# Patient Record
Sex: Female | Born: 1950 | Race: Black or African American | Hispanic: No | Marital: Single | Smoking: Former smoker
Health system: Southern US, Community
[De-identification: ages and names within clinical notes are randomized; demographics above are authoritative.]

## PROBLEM LIST (undated history)

## (undated) DIAGNOSIS — I10 Essential (primary) hypertension: Secondary | ICD-10-CM

## (undated) HISTORY — PX: HAND SURGERY: SHX662

---

## 2009-03-13 ENCOUNTER — Emergency Department (HOSPITAL_COMMUNITY): Admission: EM | Admit: 2009-03-13 | Discharge: 2009-03-13 | Payer: Self-pay | Admitting: Emergency Medicine

## 2010-04-22 ENCOUNTER — Emergency Department (HOSPITAL_COMMUNITY): Admission: EM | Admit: 2010-04-22 | Discharge: 2010-04-23 | Payer: Self-pay | Admitting: Emergency Medicine

## 2010-08-24 ENCOUNTER — Encounter: Payer: Self-pay | Admitting: Internal Medicine

## 2010-08-24 LAB — CONVERTED CEMR LAB
Hemoglobin: 12.8 g/dL
MCV: 93.8 fL
Platelets: 299 10*3/uL
Sed Rate: 17 mm/hr

## 2010-08-25 ENCOUNTER — Encounter (INDEPENDENT_AMBULATORY_CARE_PROVIDER_SITE_OTHER): Payer: Self-pay | Admitting: *Deleted

## 2010-10-11 ENCOUNTER — Ambulatory Visit: Payer: Self-pay | Admitting: Internal Medicine

## 2010-10-11 DIAGNOSIS — K5909 Other constipation: Secondary | ICD-10-CM

## 2011-01-09 NOTE — Assessment & Plan Note (Signed)
Summary: constipation--ch.   History of Present Illness Visit Type: Initial Consult Primary GI MD: Stan Head MD [facility] Primary Jaline Pincock: Natasha Bence Requesting Beth Spackman: Clydie Braun Richter,MD Chief Complaint: constipation, pt was given Miralax at [facility] which helped her now she is out of that prescription History of Present Illness:   60 yo [place] woman with chronic constipation x 20 years. History mostly from interpreter. She has had infrequent defecation problems for years. She was seen at [facility] and was given Miralax three times a day which worked very well. All other GI ROS negative.           Preventive Screening-Counseling & Management  Alcohol-Tobacco     Smoking Status: quit      Drug Use:  no.      Current Medications (verified): 1)  Ultracet 37.5-325 Mg Tabs (Tramadol-Acetaminophen) .... 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  Alcohol Use - yes Daily Caffeine Use Illicit Drug Use - no Smoking Status:  quit Drug Use:  no  Review of Systems       The patient complains of allergy/sinus, arthritis/joint pain, back pain, itching, muscle pains/cramps, and night sweats.         All other ROS negative except as per HPI.   Vital Signs:  Patient profile:   60 year old female Height:      63 inches Weight:      134 pounds BMI:     23.82 BSA:     1.63 Pulse rate:   76 / minute Pulse rhythm:   regular BP sitting:   140 / 90  (left arm)  Vitals Entered By: Merri Ray CMA Duncan Dull) (October 11, 2010 11:12 AM)  Physical Exam  General:  Well developed, well nourished, no acute distress. Eyes:  PERRLA, no icterus. Mouth:  No deformity or lesions, dentition with  caries Neck:  Supple; no masses or thyromegaly. Lungs:  Clear throughout to auscultation. Heart:  Regular rate and rhythm; no murmurs, rubs,  or bruits. Abdomen:  Soft, nontender and nondistended. No masses, hepatosplenomegaly or hernias noted. Normal bowel sounds. Rectal:  female staff present  old external hemorrhoids normal tone, normal voluntary squeeze and valsalva, small amount of bown stool Extremities:  no edema    Impression & Recommendations:  Problem # 1:  CONSTIPATION, CHRONIC (ICD-564.09) Assessment New Clearly a chronic issue so no change in bowels. CBC and ESR ok 08/24/10 She should have a screening colonoscopy at some point and this was recommended. Handouts given also and rationale explained. She is having back and joint pain and wants that evaluated and treated at this time. She will resume MiraLAx and return for screening colonoscopy when ready.  Patient Instructions: 1)  You may purchase Miralax over the counter and use up to 3 times a day. 2)  A screening colonoscopy is recommended.  You can call back to schedule that. 3)  Please return to [facility] to follow up on your back pain and arthritis. 4)  Copy sent to : Nadyne Coombes, MD 5)  The medication list was reviewed and reconciled.  All changed / newly prescribed medications were explained.  A complete medication list was provided to the patient / caregiver.

## 2011-01-09 NOTE — Letter (Signed)
Summary: New Patient letter  [facility]  [address] [place], [place] [address]   Phone: [phone]  Fax: [phone]       08/25/2010 MRN: [id number]  Brandy Bond [address] [place], [place]  [address]  Dear [name],  Welcome to the Gastroenterology Division at [facility].    You are scheduled to see Dr.  Leone Payor on 10-11-10 at 10:30a.m. on the 3rd floor at [facility], [address]  We ask that you try to arrive at our office 15 minutes prior to your appointment time to allow for check-in.  We would like you to complete the enclosed self-administered evaluation form prior to your visit and bring it with you on the day of your appointment.  We will review it with you.  Also, please bring a complete list of all your medications or, if you prefer, bring the medication bottles and we will list them.  Please bring your insurance card so that we may make a copy of it.  If your insurance requires a referral to see a specialist, please bring your referral form from your primary care physician.  Co-payments are due at the time of your visit and may be paid by cash, check or credit card.     Your office visit will consist of a consult with your physician (includes a physical exam), any laboratory testing he/she may order, scheduling of any necessary diagnostic testing (e.g. x-ray, ultrasound, CT-scan), and scheduling of a procedure (e.g. Endoscopy, Colonoscopy) if required.  Please allow enough time on your schedule to allow for any/all of these possibilities.    If you cannot keep your appointment, please call [phone] to cancel or reschedule prior to your appointment date.  This allows [place] the opportunity to schedule an appointment for another patient in need of care.  If you do not cancel or reschedule by 5 p.m. the business day prior to your appointment date, you will be charged a $50.00 late cancellation/no-show fee.    Thank you for choosing  [facility] for your medical needs.  We appreciate the opportunity to care for you.  Please visit [place] at our website  to learn more about our practice.                     Sincerely,                                                             The Gastroenterology Division

## 2011-01-09 NOTE — Letter (Signed)
Summary: [facility]   Imported By: Lester Chenoweth 10/13/2010 09:15:53  _____________________________________________________________________  External Attachment:    Type:   Image     Comment:   External Document

## 2011-02-27 LAB — URINALYSIS, ROUTINE W REFLEX MICROSCOPIC
Glucose, UA: NEGATIVE mg/dL
Hgb urine dipstick: NEGATIVE
pH: 6 (ref 5.0–8.0)

## 2011-02-27 LAB — BASIC METABOLIC PANEL
BUN: 10 mg/dL (ref 6–23)
Calcium: 8.8 mg/dL (ref 8.4–10.5)
Creatinine, Ser: 0.82 mg/dL (ref 0.4–1.2)
GFR calc non Af Amer: >60 mL/min (ref >60)
Sodium: 140 mEq/L (ref 135–145)

## 2011-12-14 ENCOUNTER — Emergency Department (HOSPITAL_COMMUNITY): Payer: No Typology Code available for payment source

## 2011-12-14 ENCOUNTER — Emergency Department (HOSPITAL_COMMUNITY)
Admission: EM | Admit: 2011-12-14 | Discharge: 2011-12-14 | Disposition: A | Discharge status: Home / Self Care | Payer: No Typology Code available for payment source | Attending: Emergency Medicine | Admitting: Emergency Medicine

## 2011-12-14 ENCOUNTER — Encounter: Payer: Self-pay | Admitting: Emergency Medicine

## 2011-12-14 DIAGNOSIS — R05 Cough: Secondary | ICD-10-CM | POA: Insufficient documentation

## 2011-12-14 DIAGNOSIS — M542 Cervicalgia: Secondary | ICD-10-CM | POA: Insufficient documentation

## 2011-12-14 DIAGNOSIS — M545 Low back pain, unspecified: Secondary | ICD-10-CM | POA: Insufficient documentation

## 2011-12-14 DIAGNOSIS — R51 Headache: Secondary | ICD-10-CM | POA: Insufficient documentation

## 2011-12-14 DIAGNOSIS — M25559 Pain in unspecified hip: Secondary | ICD-10-CM | POA: Insufficient documentation

## 2011-12-14 DIAGNOSIS — R079 Chest pain, unspecified: Secondary | ICD-10-CM | POA: Insufficient documentation

## 2011-12-14 DIAGNOSIS — R059 Cough, unspecified: Secondary | ICD-10-CM | POA: Insufficient documentation

## 2011-12-14 DIAGNOSIS — R0602 Shortness of breath: Secondary | ICD-10-CM | POA: Insufficient documentation

## 2011-12-14 DIAGNOSIS — M791 Myalgia, unspecified site: Secondary | ICD-10-CM

## 2011-12-14 DIAGNOSIS — T1490XA Injury, unspecified, initial encounter: Secondary | ICD-10-CM | POA: Insufficient documentation

## 2011-12-14 DIAGNOSIS — IMO0001 Reserved for inherently not codable concepts without codable children: Secondary | ICD-10-CM | POA: Insufficient documentation

## 2011-12-14 HISTORY — DX: Essential (primary) hypertension: I10

## 2011-12-14 MED ORDER — IBUPROFEN 800 MG PO TABS: 800.0000 mg | ORAL_TABLET | Freq: Three times a day (TID) | ORAL | Status: AC

## 2011-12-14 MED ORDER — OXYCODONE-ACETAMINOPHEN 5-325 MG PO TABS: 1.0000 | ORAL_TABLET | ORAL | Status: AC | PRN

## 2011-12-14 MED ORDER — OXYCODONE-ACETAMINOPHEN 5-325 MG PO TABS
1.0000 | ORAL_TABLET | Freq: Once | ORAL | Status: AC
  Administered 2011-12-14: 1 via ORAL
  Filled 2011-12-14: qty 1

## 2011-12-14 MED ORDER — CYCLOBENZAPRINE HCL 10 MG PO TABS: 10.0000 mg | ORAL_TABLET | Freq: Two times a day (BID) | ORAL | Status: AC | PRN
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
## 2011-12-14 NOTE — ED Provider Notes (Signed)
Spoke with patient briefly about the results of her CT's and x-rays. Discussed the fact that she would be sore for the next 2-3 days and that prescriptions have been provided for her pain and discomfort. Encouraged her to take medications as directed and to follow up with her primary care physician if symptoms do not improve. Patient agreeable with plan  Leanne Chang, NP 12/14/11 2200

## 2011-12-14 NOTE — ED Notes (Signed)
Patient transported to X-ray 

## 2011-12-21 NOTE — ED Provider Notes (Signed)
Evaluation and management procedures were performed by the PA/NP under my supervision/collaboration.    Felisa Bonier, MD 12/21/11 [phone]

## 2011-12-21 NOTE — ED Provider Notes (Signed)
Evaluation and management procedures were performed by the PA/NP under my supervision/collaboration.    Lashawnda Hancox D Marvelyn Bouchillon, MD 12/21/11 1629 

## 2014-04-20 ENCOUNTER — Encounter (HOSPITAL_COMMUNITY): Payer: Self-pay | Admitting: Emergency Medicine

## 2014-04-20 ENCOUNTER — Emergency Department (HOSPITAL_COMMUNITY)
Admission: EM | Admit: 2014-04-20 | Discharge: 2014-04-20 | Disposition: A | Discharge status: Home / Self Care | Payer: Self-pay | Attending: Emergency Medicine | Admitting: Emergency Medicine

## 2014-04-20 ENCOUNTER — Emergency Department (HOSPITAL_COMMUNITY): Payer: Self-pay

## 2014-04-20 DIAGNOSIS — Z79899 Other long term (current) drug therapy: Secondary | ICD-10-CM | POA: Insufficient documentation

## 2014-04-20 DIAGNOSIS — I10 Essential (primary) hypertension: Secondary | ICD-10-CM | POA: Insufficient documentation

## 2014-04-20 DIAGNOSIS — M25469 Effusion, unspecified knee: Secondary | ICD-10-CM | POA: Insufficient documentation

## 2014-04-20 DIAGNOSIS — M25462 Effusion, left knee: Secondary | ICD-10-CM

## 2014-04-20 DIAGNOSIS — Z880 Allergy status to penicillin: Secondary | ICD-10-CM | POA: Insufficient documentation

## 2014-04-20 LAB — SYNOVIAL CELL COUNT + DIFF, W/ CRYSTALS
CRYSTALS FLUID: NONE SEEN
EOSINOPHILS-SYNOVIAL: 0 % (ref 0–1)
Lymphocytes-Synovial Fld: 0 % (ref 0–20)
Monocyte-Macrophage-Synovial Fluid: 31 % — ABNORMAL LOW (ref 50–90)
NEUTROPHIL, SYNOVIAL: 69 % — AB (ref 0–25)
WBC, Synovial: 46200 /mm3 — ABNORMAL HIGH (ref 0–200)

## 2014-04-20 MED ORDER — OXYCODONE-ACETAMINOPHEN 5-325 MG PO TABS: 1.0000 | ORAL_TABLET | ORAL | Status: DC | PRN

## 2014-04-20 MED ORDER — MELOXICAM 7.5 MG PO TABS
7.5000 mg | ORAL_TABLET | Freq: Every day | ORAL | Status: DC
Start: 2014-04-20 — End: 2014-06-18

## 2014-04-20 MED ORDER — OXYCODONE-ACETAMINOPHEN 5-325 MG PO TABS: 1.0000 | ORAL_TABLET | Freq: Once | ORAL | Status: DC

## 2014-04-20 MED ORDER — OXYCODONE-ACETAMINOPHEN 5-325 MG PO TABS
1.0000 | ORAL_TABLET | Freq: Once | ORAL | Status: AC
  Administered 2014-04-20: 1 via ORAL
  Filled 2014-04-20: qty 1

## 2014-04-20 NOTE — Progress Notes (Signed)
[facility] did not see patient but will be sending information about [facility], using the address provided, to help patient establish primary care.

## 2014-04-20 NOTE — ED Notes (Signed)
Pt c/om left knee swelling, denies injury. States it hurts when walking on it.

## 2014-04-20 NOTE — Discharge Instructions (Signed)
Knee Effusion  Knee effusion means you have fluid in your knee. The knee may be more difficult to bend and move. HOME CARE  Use crutches or a brace as told by your doctor.  Put ice on the injured area.  Put ice in a plastic bag.  Place a towel between your skin and the bag.  Leave the ice on for 15-20 minutes, 03-04 times a day.  Raise (elevate) your knee as much as possible.  Only take medicine as told by your doctor.  You may need to do strengthening exercises. Ask your doctor.  Continue with your normal diet and activities as told by your doctor. GET HELP RIGHT AWAY IF:  You have more puffiness (swelling) in your knee.  You see redness, puffiness, or have more pain in your knee.  You have a temperature by mouth above 102 F (38.9 C).  You get a rash.  You have trouble breathing.  You have a reaction to any medicine you are taking.  You have a lot of pain when you move your knee. MAKE SURE YOU:  Understand these instructions.  Will watch your condition.  Will get help right away if you are not doing well or get worse. Document Released: 12/29/2010 Document Revised: 02/18/2012 Document Reviewed: 12/29/2010 [facility] Patient Information 2014 [place], [place]. Cryotherapy Cryotherapy means treatment with cold. Ice or gel packs can be used to reduce both pain and swelling. Ice is the most helpful within the first 24 to 48 hours after an injury or flareup from overusing a muscle or joint. Sprains, strains, spasms, burning pain, shooting pain, and aches can all be eased with ice. Ice can also be used when recovering from surgery. Ice is effective, has very few side effects, and is safe for most people to use. PRECAUTIONS  Ice is not a safe treatment option for people with:  Raynaud's phenomenon. This is a condition affecting small blood vessels in the extremities. Exposure to cold may cause your problems to return.  Cold hypersensitivity. There are many forms of cold  hypersensitivity, including:  Cold urticaria. Red, itchy hives appear on the skin when the tissues begin to warm after being iced.  Cold erythema. This is a red, itchy rash caused by exposure to cold.  Cold hemoglobinuria. Red blood cells break down when the tissues begin to warm after being iced. The hemoglobin that carry oxygen are passed into the urine because they cannot combine with blood proteins fast enough.  Numbness or altered sensitivity in the area being iced. If you have any of the following conditions, do not use ice until you have discussed cryotherapy with your caregiver:  Heart conditions, such as arrhythmia, angina, or chronic heart disease.  High blood pressure.  Healing wounds or open skin in the area being iced.  Current infections.  Rheumatoid arthritis.  Poor circulation.  Diabetes. Ice slows the blood flow in the region it is applied. This is beneficial when trying to stop inflamed tissues from spreading irritating chemicals to surrounding tissues. However, if you expose your skin to cold temperatures for too long or without the proper protection, you can damage your skin or nerves. Watch for signs of skin damage due to cold. HOME CARE INSTRUCTIONS Follow these tips to use ice and cold packs safely.  Place a dry or damp towel between the ice and skin. A damp towel will cool the skin more quickly, so you may need to shorten the time that the ice is used.  For  a more rapid response, add gentle compression to the ice.  Ice for no more than 10 to 20 minutes at a time. The bonier the area you are icing, the less time it will take to get the benefits of ice.  Check your skin after 5 minutes to make sure there are no signs of a poor response to cold or skin damage.  Rest 20 minutes or more in between uses.  Once your skin is numb, you can end your treatment. You can test numbness by very lightly touching your skin. The touch should be so light that you do not see  the skin dimple from the pressure of your fingertip. When using ice, most people will feel these normal sensations in this order: cold, burning, aching, and numbness.  Do not use ice on someone who cannot communicate their responses to pain, such as small children or people with dementia. HOW TO MAKE AN ICE PACK Ice packs are the most common way to use ice therapy. Other methods include ice massage, ice baths, and cryo-sprays. Muscle creams that cause a cold, tingly feeling do not offer the same benefits that ice offers and should not be used as a substitute unless recommended by your caregiver. To make an ice pack, do one of the following:  Place crushed ice or a bag of frozen vegetables in a sealable plastic bag. Squeeze out the excess air. Place this bag inside another plastic bag. Slide the bag into a pillowcase or place a damp towel between your skin and the bag.  Mix 3 parts water with 1 part rubbing alcohol. Freeze the mixture in a sealable plastic bag. When you remove the mixture from the freezer, it will be slushy. Squeeze out the excess air. Place this bag inside another plastic bag. Slide the bag into a pillowcase or place a damp towel between your skin and the bag. SEEK MEDICAL CARE IF:  You develop white spots on your skin. This may give the skin a blotchy (mottled) appearance.  Your skin turns blue or pale.  Your skin becomes waxy or hard.  Your swelling gets worse. MAKE SURE YOU:   Understand these instructions.  Will watch your condition.  Will get help right away if you are not doing well or get worse. Document Released: 07/23/2011 Document Revised: 02/18/2012 Document Reviewed: 07/23/2011 [facility] Patient Information 2014 [place].

## 2014-04-20 NOTE — ED Provider Notes (Signed)
CSN: [id number]     Arrival date & time 04/20/14  0931 History   First MD Initiated Contact with Patient 04/20/14 1013     Chief Complaint  Patient presents with  . Joint Swelling    left     (Consider location/radiation/quality/duration/timing/severity/associated sxs/prior Treatment) HPI Comments:  Brandy Bond is a 63 yo female presenting with a chief complaint of left knee swelling since last night. Pt stated she has daily knee pain as she works as a Social research officer, government, but yesterday morning the pain was worse than usual. Pt noticed the knee became swollen last night and increasingly painful behind the knee, making it difficult to bear weight. Pt denies any previous episodes of knee or joint swelling. Pt takes naproxen for her daily knee pain, but it did not improve her pain today. Pt rates pain 10/10. Pt denies fever, chest pain, shortness of breath, calf pain, and ankle edema.  The history is provided by the patient.    Past Medical History  Diagnosis Date  . Hypertension    History reviewed. No pertinent past surgical history. No family history on file. History  Substance Use Topics  . Smoking status: Never Smoker   . Smokeless tobacco: Not on file  . Alcohol Use: No   OB History   Grav Para Term Preterm Abortions TAB SAB Ect Mult Living                 Review of Systems  Constitutional:       Denies fever and chills  Cardiovascular:       Denies chest pain, SOB, and cough  Gastrointestinal:       Denies abdominal pain, nausea, and vomiting  Genitourinary:       Denies dysuria and hematuria  Musculoskeletal:       Endorses left knee pain and swelling. Denies calf pain or thigh pain  Skin:       Denies rash      Allergies  Penicillins  Home Medications   Prior to Admission medications   Medication Sig Start Date End Date Taking? Authorizing Provider  Iron-Vitamins (GERITOL COMPLETE PO) Take 1 tablet by mouth daily.     Yes Historical Provider, MD  naproxen sodium  (ANAPROX) 220 MG tablet Take 220 mg by mouth daily as needed. pain    Yes Historical Provider, MD   BP 153/88  Pulse 103  Temp(Src) 98.5 F (36.9 C) (Oral)  Resp 16  SpO2 99% Physical Exam  Constitutional: She is oriented to person, place, and time. She appears well-developed and well-nourished. No distress.  Cardiovascular: Normal rate.   No murmur heard. Pulmonary/Chest: Effort normal. She has no wheezes. She has no rales.  Abdominal: Soft.  Musculoskeletal:  Left knee with anterior and posterior swelling, tender to moderate palpation posteriorly. Decreased ROM secondary to pain. 2+ popliteal pulse, 2+ dorsal pedal pulse. Without crepitus, bony deformity, or evidence of trauma. No calf or thigh tenderness or swelling. No redness or warmth to palpation.  Neurological: She is alert and oriented to person, place, and time.  Skin: Skin is warm and dry. No erythema.    ED Course  ARTHOCENTESIS Date/Time: 04/20/2014 12:40 PM Performed by: Elpidio Anis A Authorized by: Elpidio Anis A Consent: Verbal consent obtained. written consent not obtained. Risks and benefits: risks, benefits and alternatives were discussed Consent given by: patient Patient understanding: patient states understanding of the procedure being performed Patient identity confirmed: verbally with patient Indications: joint swelling,  pain and diagnostic  evaluation  Body area: knee Joint: left knee Local anesthesia used: yes Anesthesia: local infiltration Local anesthetic: lidocaine 2% with epinephrine Anesthetic total: 2 ml Patient sedated: no Preparation: Patient was prepped and draped in the usual sterile fashion. Needle gauge: 18 G Ultrasound guidance: no Approach: medial Aspirate: yellow and clear Aspirate amount: 20 ml Patient tolerance: Patient tolerated the procedure well with no immediate complications.   (including critical care time) Labs Review Labs Reviewed - No data to display  Imaging  Review Dg Knee Complete 4 Views Left  04/20/2014   CLINICAL DATA:  LEFT knee pain and swelling for 2 days, no known injury  EXAM: LEFT KNEE - COMPLETE 4+ VIEW  COMPARISON:  None  FINDINGS: Diffuse osseous demineralization.  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  No acute fracture, dislocation or bone destruction.  Large knee joint effusion.  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  Osseous demineralization.   Electronically Signed   By: Ulyses SouthwardMark  Boles M.D.   On: 04/20/2014 10:09     EKG Interpretation None      MDM   Final diagnoses:  None    1. Left knee effusion  Referred to orthopedics for further management. Knee immobilizer and crutches for support. Pain management provided.   Arnoldo HookerShari A Travez Stancil, PA-C 04/20/14 1241

## 2014-04-23 LAB — BODY FLUID CULTURE
Culture: NO GROWTH
Special Requests: NORMAL

## 2014-04-23 NOTE — ED Provider Notes (Signed)
Medical screening examination/treatment/procedure(s) were performed by non-physician practitioner and as supervising physician I was immediately available for consultation/collaboration.   EKG Interpretation None        Gwyneth Sprout, MD 04/23/14 1556

## 2014-06-18 ENCOUNTER — Encounter (HOSPITAL_COMMUNITY): Payer: Self-pay | Admitting: Emergency Medicine

## 2014-06-18 ENCOUNTER — Emergency Department (HOSPITAL_COMMUNITY)
Admission: EM | Admit: 2014-06-18 | Discharge: 2014-06-18 | Disposition: A | Discharge status: Home / Self Care | Payer: Worker's Compensation | Attending: Emergency Medicine | Admitting: Emergency Medicine

## 2014-06-18 ENCOUNTER — Emergency Department (HOSPITAL_COMMUNITY): Payer: Worker's Compensation

## 2014-06-18 DIAGNOSIS — S52509A Unspecified fracture of the lower end of unspecified radius, initial encounter for closed fracture: Secondary | ICD-10-CM | POA: Diagnosis not present

## 2014-06-18 DIAGNOSIS — S52501A Unspecified fracture of the lower end of right radius, initial encounter for closed fracture: Secondary | ICD-10-CM

## 2014-06-18 DIAGNOSIS — W108XXA Fall (on) (from) other stairs and steps, initial encounter: Secondary | ICD-10-CM | POA: Insufficient documentation

## 2014-06-18 DIAGNOSIS — S52609A Unspecified fracture of lower end of unspecified ulna, initial encounter for closed fracture: Secondary | ICD-10-CM | POA: Diagnosis not present

## 2014-06-18 DIAGNOSIS — Z791 Long term (current) use of non-steroidal anti-inflammatories (NSAID): Secondary | ICD-10-CM | POA: Diagnosis not present

## 2014-06-18 DIAGNOSIS — Y9289 Other specified places as the place of occurrence of the external cause: Secondary | ICD-10-CM | POA: Insufficient documentation

## 2014-06-18 DIAGNOSIS — Y9389 Activity, other specified: Secondary | ICD-10-CM | POA: Insufficient documentation

## 2014-06-18 DIAGNOSIS — S59909A Unspecified injury of unspecified elbow, initial encounter: Secondary | ICD-10-CM | POA: Diagnosis present

## 2014-06-18 DIAGNOSIS — S52601A Unspecified fracture of lower end of right ulna, initial encounter for closed fracture: Secondary | ICD-10-CM

## 2014-06-18 DIAGNOSIS — Z79899 Other long term (current) drug therapy: Secondary | ICD-10-CM | POA: Insufficient documentation

## 2014-06-18 DIAGNOSIS — Z880 Allergy status to penicillin: Secondary | ICD-10-CM | POA: Insufficient documentation

## 2014-06-18 DIAGNOSIS — I10 Essential (primary) hypertension: Secondary | ICD-10-CM | POA: Insufficient documentation

## 2014-06-18 MED ORDER — MORPHINE SULFATE 4 MG/ML IJ SOLN
4.0000 mg | Freq: Once | INTRAMUSCULAR | Status: AC
  Administered 2014-06-18: 4 mg via INTRAVENOUS
  Filled 2014-06-18: qty 1

## 2014-06-18 MED ORDER — ONDANSETRON HCL 4 MG/2ML IJ SOLN
4.0000 mg | Freq: Once | INTRAMUSCULAR | Status: AC
  Administered 2014-06-18: 4 mg via INTRAVENOUS
  Filled 2014-06-18: qty 2

## 2014-06-18 MED ORDER — HYDROMORPHONE HCL PF 1 MG/ML IJ SOLN
1.0000 mg | Freq: Once | INTRAMUSCULAR | Status: AC
  Administered 2014-06-18: 1 mg via INTRAVENOUS
  Filled 2014-06-18: qty 1

## 2014-06-18 MED ORDER — OXYCODONE-ACETAMINOPHEN 5-325 MG PO TABS: 1.0000 | ORAL_TABLET | ORAL | Status: DC | PRN
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
## 2014-06-18 NOTE — Discharge Instructions (Signed)
Please call Dr. Ronie SpiesWeingold's office first thing Monday morning.  Read the information mode carefully. Please ice and elevate your wrist. Do not use or hand for daily activities. Discharge ED with a strong pain medication. Do not drive, operate heavy machinery, drink alcohol, or take other tylenol containing products with this medicine.  SEEK IMMEDIATE MEDICAL CARE IF:  Your cast or splint gets damaged or breaks. You have continued severe pain or more swelling than you did before the cast was put on. Your skin or fingernails below the injury turn blue or gray or feel cold or numb. You develop decreased feeling in your fingers.     Wrist Fracture A wrist fracture is a break in one of the bones of the wrist. Your wrist is made up of several small bones at the palm of your hand (carpal bones) and the two bones that make up your forearm (radius and ulna). The bones come together to form multiple large and small joints. The shape and design of these joints allow your wrist to bend and straighten, move side-to-side, and rotate, as in twisting your palm up or down. CAUSES  A fracture may occur in any of the bones in your wrist when enough force is applied to the wrist, such as when falling down onto an outstretched hand. Severe injuries may occur from a more forceful injury. SYMPTOMS Symptoms of wrist fractures include tenderness, bruising, and swelling. Additionally, the wrist may hang in an odd position or may be misshaped. DIAGNOSIS To diagnose a wrist fracture, your caregiver will physically examine your wrist. Your caregiver may also request an X-ray exam of your wrist. TREATMENT Treatment depends on many factors, including the nature and location of the fracture, your age, and your activity level. Treatment for wrist fracture can be nonsurgical or surgical. For nonsurgical treatment, a plaster cast or splint may be applied to your wrist if the bone is in a good position (aligned). The cast will  stay on for about 6 weeks. If the alignment of your bone is not good, it may be necessary to realign (reduce) it. After the bone is reduced, a splint usually is placed on your wrist to allow for a small amount of normal swelling. After about 1 week, the splint is removed and a cast is added. The cast is removed 2 or 3 weeks later, after the swelling goes down, causing the cast to loosen. Another cast is applied. This cast is removed after about another 2 or 3 weeks, for a total of 4 to 6 weeks of immobilization. Sometimes the position of the bone is so far out of place that surgery is required to apply a device to hold it together as it heals. If the bone cannot be reduced without cutting the skin around the bone (closed reduction), a cut (incision) is made to allow direct access to the bone to reduce it (open reduction). Depending on the fracture, there are a number of options for holding the bone in place while it heals, including a cast, metal pins, a plate and screws, and a device called an external fixator. With an external fixator, most of the hardware remains outside of the body. HOME CARE INSTRUCTIONS  To lessen swelling, keep your injured wrist elevated and move your fingers as much as possible.  Apply ice to your wrist for the first 1 to 2 days after you have been treated or as directed by your caregiver. Applying ice helps to reduce inflammation and pain.  Put  ice in a plastic bag.  Place a towel between your skin and the bag.  Leave the ice on for 15 to 20 minutes at a time, every 2 hours while you are awake.  Do not put pressure on any part of your cast or splint. It may break.  Use a plastic bag to protect your cast or splint from water while bathing or showering. Do not lower your cast or splint into water.  Only take over-the-counter or prescription medicines for pain as directed by your caregiver.  MAKE SURE YOU:  Understand these instructions.  Will watch your  condition.  Will get help right away if you are not doing well or get worse. Document Released: 09/05/2005 Document Revised: 02/18/2012 Document Reviewed: 12/14/2011 [facility] Patient Information 2015 [place], [place]. This information is not intended to replace advice given to you by your health care provider. Make sure you discuss any questions you have with your health care provider.

## 2014-06-18 NOTE — ED Notes (Addendum)
Per EMS: Pt reports sweeping steps at work and falling down three steps. Pt reports extending her arm and now reports wrist pain. Pt denies neck, back, or hip pain. Pt was given 150 mcgs of fentanyl in route to facility. Capillary refill is less than two seconds.

## 2014-06-18 NOTE — ED Provider Notes (Signed)
CSN: [id number]     Arrival date & time 06/18/14  1625 History   First MD Initiated Contact with Patient 06/18/14 1629     Chief Complaint  Patient presents with  . Wrist Injury     (Consider location/radiation/quality/duration/timing/severity/associated sxs/prior Treatment) HPI Comments:  Brandy Bond is a 63 y.o. female who sustained a right wrist injury just prior to arrival. Mechanism of injury: Patient fell backward on the stairs onto her R hand. Immediate symptoms: immediate pain, immediate swelling, inability to use arm directly after injury, deformity was immediately noted. Symptoms have been acute and constant since that time. Prior history of related problems: no prior problems with this area in the past. Denies numbness or tingling     Patient is a 63 y.o. female presenting with wrist pain. The history is provided by the patient. No language interpreter was used.  Wrist Pain This is a new problem. The current episode started today. The problem occurs constantly. The problem has been unchanged. Associated symptoms include arthralgias and joint swelling. Pertinent negatives include no abdominal pain, anorexia, change in bowel habit, chest pain, chills, congestion, coughing, diaphoresis, fatigue, fever, headaches, myalgias, nausea, neck pain, numbness, rash, sore throat, swollen glands, urinary symptoms, vertigo, visual change, vomiting or weakness. Exacerbated by: any movement. She has tried nothing for the symptoms.    Past Medical History  Diagnosis Date  . Hypertension    History reviewed. No pertinent past surgical history. No family history on file. History  Substance Use Topics  . Smoking status: Never Smoker   . Smokeless tobacco: Not on file  . Alcohol Use: No   OB History   Grav Para Term Preterm Abortions TAB SAB Ect Mult Living                 Review of Systems  Constitutional: Negative for fever, chills, diaphoresis and fatigue.  HENT: Negative for  congestion and sore throat.   Respiratory: Negative for cough.   Cardiovascular: Negative for chest pain.  Gastrointestinal: Negative for nausea, vomiting, abdominal pain, anorexia and change in bowel habit.  Musculoskeletal: Positive for arthralgias and joint swelling. Negative for myalgias and neck pain.  Skin: Negative for rash.  Neurological: Negative for vertigo, weakness, numbness and headaches.  All other systems reviewed and are negative.     Allergies  Penicillins  Home Medications   Prior to Admission medications   Medication Sig Start Date End Date Taking? Authorizing Provider  Iron-Vitamins (GERITOL COMPLETE PO) Take 1 tablet by mouth daily.      Historical Provider, MD  meloxicam (MOBIC) 7.5 MG tablet Take 1 tablet (7.5 mg total) by mouth daily. 04/20/14   Shari A Upstill, PA-C  naproxen sodium (ANAPROX) 220 MG tablet Take 220 mg by mouth daily as needed. pain     Historical Provider, MD  oxyCODONE-acetaminophen (PERCOCET/ROXICET) 5-325 MG per tablet Take 1-2 tablets by mouth every 4 (four) hours as needed for severe pain. 04/20/14   Shari A Upstill, PA-C   BP 185/113  Pulse 86  Temp(Src) 98.5 F (36.9 C) (Oral)  Resp 20  SpO2 95% Physical Exam  Nursing note and vitals reviewed. Constitutional: She is oriented to person, place, and time. She appears well-developed and well-nourished. No distress.  Appears extremely uncomfortable.  HENT:  Head: Normocephalic and atraumatic.  Eyes: Conjunctivae are normal. No scleral icterus.  Neck: Normal range of motion.  Cardiovascular: Normal rate, regular rhythm and normal heart sounds.  Exam reveals no gallop and no friction  rub.   No murmur heard. Pulmonary/Chest: Effort normal and breath sounds normal. No respiratory distress.  Abdominal: Soft. Bowel sounds are normal. She exhibits no distension and no mass. There is no tenderness. There is no guarding.  Musculoskeletal:  A right wrist exam was performed. SKIN:  intact SWELLING: moderate ROM: limited by pain TENDERNESS: severe and at fracture site DEFORMITY: dinner fork deformity NEUROVASCULAR EXAM normal   Neurological: She is alert and oriented to person, place, and time.  Skin: Skin is warm and dry. She is not diaphoretic.    ED Course  Procedures (including critical care time) Labs Review Labs Reviewed - No data to display  Imaging Review No results found.   EKG Interpretation None      MDM   Final diagnoses:  Fracture of radius and ulna, distal, right, closed, initial encounter    Patient with FOOSH injury to the right wrist. + dinner fork deformity.  NV intact. Right hand dominant. Awaiting imaging results. 6:53 PM Patient with comminuted and displaced fractures of the distal radius and ulna of the right wrist. I've spoken with Dr. Mina Marble who has reviewed the radiographs. Patient will be placed in sugartong Splint. Pain medications. She is to follow up with Dr. Mina Marble first thing Monday morning. I explained to the patient that this will require surgical repair. Patient understands and expresses agreement with plan of care. She should ice and elevate the arm above her heart. Discussed return precautions with the patient. I personally reviewed the imaging tests through PACS system. I have reviewed and interpreted Lab values. I reviewed available ER/hospitalization records through the EMR    Arthor Captain, [place] 06/18/14 1859

## 2014-06-18 NOTE — Progress Notes (Signed)
  CARE MANAGEMENT ED NOTE 06/18/2014  Patient:  Brandy Bond, Brandy Bond   Account Number:  [id number]  Date Initiated:  06/18/2014  Documentation initiated by:  Radford Pax  Subjective/Objective Assessment:   Patient presents to Ed post fall sustaining comminuted and displaced fractures of the distal radius and ulna of the right wrist     Subjective/Objective Assessment Detail:     Action/Plan:   Action/Plan Detail:   Anticipated DC Date:       Status Recommendation to Physician:   Result of Recommendation:    Other ED Services  Consult Working Plan    DC Planning Services  Other  PCP issues    Choice offered to / List presented to:            Status of service:  Completed, signed off  ED Comments:   ED Comments Detail:  EDCM spoke to patient and her sister at bedside.  Patient's sister help translate to patient.  Patient's sister reports EDP has given her the name of the orthopedic doctor to follow up with but patient does not have a pcp.  [facility] provided patient with pamphlet to [facility]  Informed patient that walk ins are welcome from Mon-Thurs from 9am -1030am. Also infomed patient that she may speak to a Artist, Child psychotherapist and receive assistance with her medications at [facility].  EDCM also provided patient with list of pcps who accept self pay patients, list of discounted pharmacies and websites needymeds.org and GoodRX.com for medication assistance, financial resources in the community such as local churches and [employer], urban ministries, phone number to inquire about [employer], and DSS for [employer].  Patient thankful for resources. No further EDCM needs at this time.

## 2014-08-11 ENCOUNTER — Emergency Department (HOSPITAL_COMMUNITY)
Admission: EM | Admit: 2014-08-11 | Discharge: 2014-08-11 | Disposition: A | Discharge status: Home / Self Care | Payer: No Typology Code available for payment source | Attending: Emergency Medicine | Admitting: Emergency Medicine

## 2014-08-11 ENCOUNTER — Encounter (HOSPITAL_COMMUNITY): Payer: Self-pay | Admitting: Emergency Medicine

## 2014-08-11 DIAGNOSIS — M1710 Unilateral primary osteoarthritis, unspecified knee: Secondary | ICD-10-CM | POA: Insufficient documentation

## 2014-08-11 DIAGNOSIS — M1712 Unilateral primary osteoarthritis, left knee: Secondary | ICD-10-CM

## 2014-08-11 DIAGNOSIS — Z79899 Other long term (current) drug therapy: Secondary | ICD-10-CM | POA: Insufficient documentation

## 2014-08-11 DIAGNOSIS — M25462 Effusion, left knee: Secondary | ICD-10-CM

## 2014-08-11 DIAGNOSIS — M25469 Effusion, unspecified knee: Secondary | ICD-10-CM | POA: Insufficient documentation

## 2014-08-11 DIAGNOSIS — M25562 Pain in left knee: Secondary | ICD-10-CM

## 2014-08-11 DIAGNOSIS — M25569 Pain in unspecified knee: Secondary | ICD-10-CM | POA: Insufficient documentation

## 2014-08-11 DIAGNOSIS — I10 Essential (primary) hypertension: Secondary | ICD-10-CM | POA: Insufficient documentation

## 2014-08-11 DIAGNOSIS — G8929 Other chronic pain: Secondary | ICD-10-CM | POA: Insufficient documentation

## 2014-08-11 DIAGNOSIS — IMO0002 Reserved for concepts with insufficient information to code with codable children: Secondary | ICD-10-CM | POA: Insufficient documentation

## 2014-08-11 DIAGNOSIS — Z880 Allergy status to penicillin: Secondary | ICD-10-CM | POA: Insufficient documentation

## 2014-08-11 MED ORDER — OXYCODONE-ACETAMINOPHEN 5-325 MG PO TABS
1.0000 | ORAL_TABLET | Freq: Once | ORAL | Status: AC
  Administered 2014-08-11: 1 via ORAL
  Filled 2014-08-11: qty 1

## 2014-08-11 MED ORDER — OXYCODONE-ACETAMINOPHEN 5-325 MG PO TABS: 1.0000 | ORAL_TABLET | ORAL | Status: DC | PRN

## 2014-08-11 MED ORDER — LIDOCAINE HCL 2 % IJ SOLN
INTRAMUSCULAR | Status: AC
  Administered 2014-08-11: 400 mg
  Filled 2014-08-11: qty 20

## 2014-08-11 MED ORDER — TRIAMCINOLONE ACETONIDE 10 MG/ML IJ SUSP
10.0000 mg | Freq: Once | INTRAMUSCULAR | Status: DC
  Filled 2014-08-11: qty 1

## 2014-08-11 MED ORDER — TRIAMCINOLONE ACETONIDE 40 MG/ML IJ SUSP
10.0000 mg | Freq: Once | INTRAMUSCULAR | Status: AC
  Administered 2014-08-11: 10 mg via INTRA_ARTICULAR
  Filled 2014-08-11: qty 0.25

## 2014-08-11 NOTE — ED Provider Notes (Signed)
CSN: [id number]     Arrival date & time 08/11/14  1255 History   First MD Initiated Contact with Patient 08/11/14 1314     Chief Complaint  Patient presents with  . Knee Pain  . Joint Swelling      HPI Patient presents to the emergency department with left knee pain.  She's had chronic left knee pain for some time he reports worsening pain over the past several days.  No fevers or chills.  No redness.  She reports some pain in the posterior aspect of her left knee.  Patient is a moderate left knee joint effusion on examination.  Reports some pain with range of motion but her range of motion is not limited.  She has a history of osteoarthritis in his left knee and chronic left knee pain.  She's never seen an orthopedic surgeon for followup.  She does not have her primary care physician   Past Medical History  Diagnosis Date  . Hypertension    History reviewed. No pertinent past surgical history. History reviewed. No pertinent family history. History  Substance Use Topics  . Smoking status: Never Smoker   . Smokeless tobacco: Not on file  . Alcohol Use: No   OB History   Grav Para Term Preterm Abortions TAB SAB Ect Mult Living                 Review of Systems    Allergies  Penicillins  Home Medications   Prior to Admission medications   Medication Sig Start Date End Date Taking? Authorizing Provider  amLODipine (NORVASC) 10 MG tablet Take 10 mg by mouth daily.   Yes Historical Provider, MD  Aspirin-Caffeine (BAYER BACK & BODY PAIN EX ST) 500-32.5 MG TABS Take 1 tablet by mouth as needed (for pain).   Yes Historical Provider, MD  Iron-Vitamins (GERITOL COMPLETE PO) Take 1 tablet by mouth daily.     Yes Historical Provider, MD  naproxen sodium (ANAPROX) 220 MG tablet Take 220 mg by mouth daily as needed. pain    Yes Historical Provider, MD  oxyCODONE-acetaminophen (PERCOCET/ROXICET) 5-325 MG per tablet Take 1 tablet by mouth every 4 (four) hours as needed for severe pain.  08/11/14   Lyanne Co, MD   BP 172/93  Pulse 75  Temp(Src) 98.3 F (36.8 C) (Oral)  Resp 16  SpO2 97% Physical Exam  ED Course  ARTHOCENTESIS Date/Time: 08/11/2014 3:45 PM Performed by: Lyanne Co Authorized by: Lyanne Co Consent: Verbal consent obtained. Risks and benefits: risks, benefits and alternatives were discussed Consent given by: patient Required items: required blood products, implants, devices, and special equipment available Patient identity confirmed: verbally with patient Time out: Immediately prior to procedure a "time out" was called to verify the correct patient, procedure, equipment, support staff and site/side marked as required. Indications: joint swelling and pain  Body area: knee Joint: left knee Local anesthesia used: yes Anesthesia: local infiltration Local anesthetic: bupivacaine 0.25% with epinephrine Anesthetic total: 4 ml Patient sedated: no Preparation: Patient was prepped and draped in the usual sterile fashion. Needle gauge: 20 G Ultrasound guidance: no Approach: lateral Aspirate: serous Aspirate amount: 40 ml Triamcinolone amount: 10 mg Lidocaine 1% amount: 4 ml Patient tolerance: Patient tolerated the procedure well with no immediate complications. Comments: Synovial fluid sent for culture    Labs Review Labs Reviewed  BODY FLUID CULTURE    Imaging Review No results found.   EKG Interpretation None  MDM   Final diagnoses:  Joint effusion, knee, left  Left knee pain  Osteoarthritis of left knee, unspecified osteoarthritis type    Patient with chronic left knee pain likely secondary to severe degenerative osteoarthritis.  She will need orthopedic followup.  She does not have a primary care physician.  She'll be prescribed a short course of pain medicine.  I aspirated the synovial fluid for pain relief.  She had 4 mL of lidocaine instilled into her left knee as well as 10 mg of Kenalog for pain control.  Her  synovial fluid did not appear infected.  This was sent for culture.  Case management came and evaluated the patient is given her resources regarding primary care physicians in the community.  She understands the importance of orthopedic followup.  She consented arthrocentesis and understands the risk for infection    Lyanne Co, MD 08/11/14 1555

## 2014-08-11 NOTE — Progress Notes (Signed)
[facility] Community Liaison Stacy,  ° °Provided pt with a list of primary care resources and a [facility] [employer] Card application to help patient establish primary care.  °

## 2014-08-11 NOTE — Progress Notes (Signed)
  CARE MANAGEMENT ED NOTE 08/11/2014  Patient:  Brandy Bond, Brandy Bond   Account Number:  [id number]  Date Initiated:  08/11/2014  Documentation initiated by:  Edd Arbour  Subjective/Objective Assessment:   63 yr old self pay [place] c/o increasing L knee swelling and pain x 2 weeks.  Pain score 10/10.  Pt reports she has been seen at [facility] previously for same and "fluid was drawn off."     Subjective/Objective Assessment Detail:   no pcp as confirmed by pt and family at bedside  [facility] staff spoke with pt see [facility] note  Dr Patria Mane present removing fluid from pt knee during CM interaction with pt and family members  CM consult for Needs primary care MD and likely referral to orthopedics     Action/Plan:   ED CM spoke with to review and answer further questions from family about appointments Discussed specialists connected with [facility]   Action/Plan Detail:   Anticipated DC Date:  08/11/2014     Status Recommendation to Physician:   Result of Recommendation:    Other ED Services  Consult Working Plan    DC Planning Services  Other  PCP issues  Outpatient Services - Pt will follow up    Choice offered to / List presented to:            Status of service:  Completed, signed off  ED Comments:   ED Comments Detail:

## 2014-08-11 NOTE — Discharge Instructions (Signed)
Joint Injection  Care After  Refer to this sheet in the next few days. These instructions provide you with information on caring for yourself after you have had a joint injection. Your caregiver also may give you more specific instructions. Your treatment has been planned according to current medical practices, but problems sometimes occur. Call your caregiver if you have any problems or questions after your procedure.  After any type of joint injection, it is not uncommon to experience:  · Soreness, swelling, or bruising around the injection site.  · Mild numbness, tingling, or weakness around the injection site caused by the numbing medicine used before or with the injection.  It also is possible to experience the following effects associated with the specific agent after injection:  · Iodine-based contrast agents:  ¨ Allergic reaction (itching, hives, widespread redness, and swelling beyond the injection site).  · Corticosteroids (These effects are rare.):  ¨ Allergic reaction.  ¨ Increased blood sugar levels (If you have diabetes and you notice that your blood sugar levels have increased, notify your caregiver).  ¨ Increased blood pressure levels.  ¨ Mood swings.  · Hyaluronic acid in the use of viscosupplementation.  ¨ Temporary heat or redness.  ¨ Temporary rash and itching.  ¨ Increased fluid accumulation in the injected joint.  These effects all should resolve within a day after your procedure.   HOME CARE INSTRUCTIONS  · Limit yourself to light activity the day of your procedure. Avoid lifting heavy objects, bending, stooping, or twisting.  · Take prescription or over-the-counter pain medication as directed by your caregiver.  · You may apply ice to your injection site to reduce pain and swelling the day of your procedure. Ice may be applied 03-04 times:  ¨ Put ice in a plastic bag.  ¨ Place a towel between your skin and the bag.  ¨ Leave the ice on for no longer than 15-20 minutes each time.  SEEK  IMMEDIATE MEDICAL CARE IF:   · Pain and swelling get worse rather than better or extend beyond the injection site.  · Numbness does not go away.  · Blood or fluid continues to leak from the injection site.  · You have chest pain.  · You have swelling of your face or tongue.  · You have trouble breathing or you become dizzy.  · You develop a fever, chills, or severe tenderness at the injection site that last longer than 1 day.  MAKE SURE YOU:  · Understand these instructions.  · Watch your condition.  · Get help right away if you are not doing well or if you get worse.  Document Released: 08/09/2011 Document Revised: 02/18/2012 Document Reviewed: 08/09/2011  [employer]® Patient Information ©2015 [employer]. This information is not intended to replace advice given to you by your health care provider. Make sure you discuss any questions you have with your health care provider.

## 2014-08-11 NOTE — ED Notes (Addendum)
Pt c/o increasing L knee swelling and pain x 2 weeks.  Pain score 10/10.  Pt reports she has been seen at [facility] previously for same and "fluid was drawn off."

## 2014-08-14 LAB — BODY FLUID CULTURE
Culture: NO GROWTH
SPECIAL REQUESTS: NORMAL

## 2016-09-04 ENCOUNTER — Emergency Department (HOSPITAL_COMMUNITY)
Admission: EM | Admit: 2016-09-04 | Discharge: 2016-09-04 | Disposition: A | Discharge status: Home / Self Care | Payer: Self-pay | Attending: Emergency Medicine | Admitting: Emergency Medicine

## 2016-09-04 ENCOUNTER — Encounter (HOSPITAL_COMMUNITY): Payer: Self-pay

## 2016-09-04 DIAGNOSIS — Z7982 Long term (current) use of aspirin: Secondary | ICD-10-CM | POA: Insufficient documentation

## 2016-09-04 DIAGNOSIS — I10 Essential (primary) hypertension: Secondary | ICD-10-CM | POA: Insufficient documentation

## 2016-09-04 LAB — BASIC METABOLIC PANEL
ANION GAP: 11 (ref 5–15)
BUN: 11 mg/dL (ref 6–20)
CALCIUM: 9.3 mg/dL (ref 8.9–10.3)
CO2: 26 mmol/L (ref 22–32)
Chloride: 106 mmol/L (ref 101–111)
Creatinine, Ser: 0.81 mg/dL (ref 0.44–1.00)
Glucose, Bld: 103 mg/dL — ABNORMAL HIGH (ref 65–99)
POTASSIUM: 3.2 mmol/L — AB (ref 3.5–5.1)
SODIUM: 143 mmol/L (ref 135–145)

## 2016-09-04 LAB — CBC
HEMATOCRIT: 38.7 % (ref 36.0–46.0)
HEMOGLOBIN: 12.8 g/dL (ref 12.0–15.0)
MCH: 30.4 pg (ref 26.0–34.0)
MCHC: 33.1 g/dL (ref 30.0–36.0)
MCV: 91.9 fL (ref 78.0–100.0)
Platelets: 358 10*3/uL (ref 150–400)
RBC: 4.21 MIL/uL (ref 3.87–5.11)
RDW: 13.5 % (ref 11.5–15.5)
WBC: 8.1 10*3/uL (ref 4.0–10.5)

## 2016-09-04 MED ORDER — LISINOPRIL-HYDROCHLOROTHIAZIDE 20-25 MG PO TABS: 1.0000 | ORAL_TABLET | Freq: Every day | ORAL | 0 refills | Status: DC

## 2016-09-04 MED ORDER — POTASSIUM CHLORIDE CRYS ER 20 MEQ PO TBCR: 20.0000 meq | EXTENDED_RELEASE_TABLET | Freq: Every day | ORAL | 0 refills | Status: DC

## 2016-09-04 MED ORDER — POTASSIUM CHLORIDE CRYS ER 20 MEQ PO TBCR
40.0000 meq | EXTENDED_RELEASE_TABLET | Freq: Once | ORAL | Status: AC
  Administered 2016-09-04: 40 meq via ORAL
  Filled 2016-09-04: qty 2

## 2016-09-04 MED ORDER — HYDROCHLOROTHIAZIDE 25 MG PO TABS
25.0000 mg | ORAL_TABLET | Freq: Once | ORAL | Status: AC
  Administered 2016-09-04: 25 mg via ORAL
  Filled 2016-09-04: qty 1

## 2016-09-04 NOTE — ED Provider Notes (Signed)
Patient complained of headache last night and into today she is presently asymptomatic alert appropriate and in no distress Glasgow Coma Score 15   Doug Sou, MD 09/05/16 [phone]

## 2016-09-04 NOTE — ED Triage Notes (Signed)
Pt reports she took her BP at home last night and it was high. She reports associated headache. Denies blurred vision. No other neuro symptoms noted.

## 2016-09-04 NOTE — ED Provider Notes (Signed)
[facility]-EMERGENCY DEPT Provider Note   CSN: [id number] Arrival date & time: 09/04/16  1237     History   Chief Complaint Chief Complaint  Patient presents with  . Hypertension    HPI Brandy Bond is a 65 y.o. female.  The history is provided by the patient (joined in ED by friend).  Hypertension  This is a chronic problem. The current episode started more than 1 week ago. The problem occurs constantly. The problem has not changed since onset.Associated symptoms include headaches. Pertinent negatives include no chest pain, no abdominal pain and no shortness of breath. Associated symptoms comments: Intermittent mild to moderate global headaches, none currently . Nothing aggravates the symptoms. The symptoms are relieved by medications (has been taking friend's Norvasc, pt does not have Rx or physician ). The treatment provided mild relief.    Past Medical History:  Diagnosis Date  . Hypertension     Patient Active Problem List   Diagnosis Date Noted  . CONSTIPATION, CHRONIC 10/11/2010    History reviewed. No pertinent surgical history.  OB History    No data available       Home Medications    Prior to Admission medications   Medication Sig Start Date End Date Taking? Authorizing Provider  amLODipine (NORVASC) 10 MG tablet Take 10 mg by mouth daily.    Historical Provider, MD  Aspirin-Caffeine (BAYER BACK & BODY PAIN EX ST) 500-32.5 MG TABS Take 1 tablet by mouth as needed (for pain).    Historical Provider, MD  Iron-Vitamins (GERITOL COMPLETE PO) Take 1 tablet by mouth daily.      Historical Provider, MD  lisinopril-hydrochlorothiazide (PRINZIDE,ZESTORETIC) 20-25 MG tablet Take 1 tablet by mouth daily. 09/04/16 10/04/16  Horald PollenAudrey Kimla Furth, MD  naproxen sodium (ANAPROX) 220 MG tablet Take 220 mg by mouth daily as needed. pain     Historical Provider, MD  oxyCODONE-acetaminophen (PERCOCET/ROXICET) 5-325 MG per tablet Take 1 tablet by mouth every 4 (four) hours as needed for  severe pain. 08/11/14   Azalia BilisKevin Campos, MD  potassium chloride SA (K-DUR,KLOR-CON) 20 MEQ tablet Take 1 tablet (20 mEq total) by mouth daily. 09/04/16 09/11/16  Horald PollenAudrey Philemon Riedesel, MD    Family History History reviewed. No pertinent family history.  Social History Social History  Substance Use Topics  . Smoking status: Never Smoker  . Smokeless tobacco: Never Used  . Alcohol use Yes     Comment: occasional wine      Allergies   Penicillins   Review of Systems Review of Systems  Constitutional: Negative for chills and fever.  HENT: Negative for congestion.   Eyes: Negative for visual disturbance.       Denies blurred vision  Respiratory: Negative for chest tightness and shortness of breath.   Cardiovascular: Negative for chest pain and leg swelling.  Gastrointestinal: Negative for abdominal pain.  Genitourinary: Negative for flank pain.  Musculoskeletal: Negative for back pain.  Skin: Negative for rash.  Neurological: Positive for headaches. Negative for tremors, facial asymmetry, speech difficulty, weakness and numbness.  Psychiatric/Behavioral: Negative for confusion.     Physical Exam Updated Vital Signs BP 200/99   Pulse 67   Temp 98.4 F (36.9 C)   Resp 16   Ht 5\' 2"  (1.575 m)   Wt 65.8 kg   SpO2 97%   BMI 26.52 kg/m   Physical Exam  Constitutional: She is oriented to person, place, and time. She appears well-developed and well-nourished. No distress.  Pleasant, cooperative, well-appearing  HENT:  Head:  Normocephalic and atraumatic.  Eyes: Conjunctivae and EOM are normal. Pupils are equal, round, and reactive to light. No scleral icterus.  Neck: Normal range of motion. Neck supple. No JVD present.  Cardiovascular: Normal rate and regular rhythm.  Exam reveals no gallop and no friction rub.   No murmur heard. Pulmonary/Chest: Effort normal and breath sounds normal. No respiratory distress. She has no rales.  Abdominal: Soft. She exhibits no distension. There is  no tenderness.  Musculoskeletal: She exhibits no edema or tenderness.  Neurological: She is alert and oriented to person, place, and time. No cranial nerve deficit. She exhibits normal muscle tone. Coordination normal.  Skin: Skin is warm and dry. No rash noted. She is not diaphoretic.  Psychiatric: She has a normal mood and affect.  Nursing note and vitals reviewed.    ED Treatments / Results  Labs (all labs ordered are listed, but only abnormal results are displayed) Labs Reviewed  BASIC METABOLIC PANEL - Abnormal; Notable for the following:       Result Value   Potassium 3.2 (*)    Glucose, Bld 103 (*)    All other components within normal limits  CBC    EKG  EKG Interpretation None       Radiology No results found.  Procedures Procedures (including critical care time)  Medications Ordered in ED Medications  hydrochlorothiazide (HYDRODIURIL) tablet 25 mg (25 mg Oral Given 09/04/16 1614)  potassium chloride SA (K-DUR,KLOR-CON) CR tablet 40 mEq (40 mEq Oral Given 09/04/16 1614)     Initial Impression / Assessment and Plan / ED Course  I have reviewed the triage vital signs and the nursing notes.  Pertinent labs & imaging results that were available during my care of the patient were reviewed by me and considered in my medical decision making (see chart for details).  Clinical Course   Brandy Bond is a 65 y.o. female with ongoing HTN for several months, who presents to ED for Rx HTN medications, as she is uninsured and has not been able to establish care with PCP. Pt has been having intermittent headaches related to the HTN, had a HA earlier today, resolved. No neurologic deficits. No visual changes. No chest pains, no dyspnea. Rx Lisinopril-HCTZ 20-25 (on [employer] $4 list) x 1 month, given referral to [facility]. Rx Kcl 20 mEq x 1 week. Case management spoke to pt. Advised to return to ER for persistent severe headache, for visual changes, chest  pains, or any other new, worse, or concerning symptoms. She demonstrates understanding of this and comfort with d/c home.   Pt condition, course, and discharge were discussed with attending physician Dr. Doran Stabler.  Final Clinical Impressions(s) / ED Diagnoses   Final diagnoses:  Essential hypertension    New Prescriptions Discharge Medication List as of 09/04/2016  5:12 PM    START taking these medications   Details  lisinopril-hydrochlorothiazide (PRINZIDE,ZESTORETIC) 20-25 MG tablet Take 1 tablet by mouth daily., Starting Tue 09/04/2016, Until Thu 10/04/2016, Print         Horald Pollen, MD 09/05/16 [id number]    Doug Sou, MD 09/05/16 [phone]

## 2016-09-04 NOTE — Progress Notes (Addendum)
EDCM notified by [facility] EDSW patient needs assistance with finding pcp.  EDCM spoke to patient via phone with assist of RN Redmond Baseman.  Patient is agreeable to have [facility] email [facility] in efforts to obtain an appointment.  EDCM will also fax to [facility] ED more providers in the area who accept patient's without insurance.  Discussed the orange card with patient.  Patient thankful for assistance.  No further EDCM needs at this time.  [facility] informed unit clerk to please give resources faxed for this patient to [facility].

## 2016-09-10 ENCOUNTER — Telehealth: Payer: Self-pay

## 2016-09-10 NOTE — Telephone Encounter (Signed)
Message received from Radford Pax, RN CM requesting a hospital follow up appointment for the patient. Call placed to # [phone] (M) and a HIPAA compliant voicemail message was left requesting a call back to # [phone] or [phone].   Update provided to A. Bennie Dallas, RN CM

## 2016-09-11 ENCOUNTER — Telehealth: Payer: Self-pay

## 2016-09-11 NOTE — Telephone Encounter (Signed)
Attempted to contact the patient to discuss plans for follow up medical care. Call placed to # [phone] (M) and a HIPAA compliant voicemail message was left requesting a call back to # [phone] or [phone].

## 2016-09-11 NOTE — Telephone Encounter (Signed)
Return call received from the patient's sister, Amil Amen, requesting a call back.  Call returned to Amil Amen  #  [phone] who stated that she was returning the call for her sister, Mare Loan. Inquired if Mare Loan was present to authorize that I can speak to her Amil Amen).  She stated that Brilyn could be reached at # [phone]. Call then placed to Cailynn at the # provided by Amil Amen. Preslee provided this CM authorization to talk to Wood about scheduling an appointment.   Call placed to Yaresly to inform her that an appointment was scheduled for 09/20/16 @ 1200.  She was very appreciative of the call.  She said that she could also be reached at # [phone]. Also explained to [place] about financial counseling services at [facility]  and that an appointment can be scheduled with the financial counselor to assist with  Erskine Emery Card/ [facility] if eligible.

## 2016-09-18 ENCOUNTER — Encounter: Payer: Self-pay | Admitting: Family Medicine

## 2016-09-18 ENCOUNTER — Ambulatory Visit: Payer: Self-pay | Attending: Family Medicine | Admitting: Family Medicine

## 2016-09-18 VITALS — BP 135/86 | HR 85 | Temp 98.6°F | Ht 61.0 in | Wt 123.4 lb

## 2016-09-18 DIAGNOSIS — M25562 Pain in left knee: Secondary | ICD-10-CM | POA: Insufficient documentation

## 2016-09-18 DIAGNOSIS — G8929 Other chronic pain: Secondary | ICD-10-CM | POA: Insufficient documentation

## 2016-09-18 DIAGNOSIS — Z23 Encounter for immunization: Secondary | ICD-10-CM

## 2016-09-18 DIAGNOSIS — I10 Essential (primary) hypertension: Secondary | ICD-10-CM | POA: Insufficient documentation

## 2016-09-18 DIAGNOSIS — E786 Lipoprotein deficiency: Secondary | ICD-10-CM | POA: Insufficient documentation

## 2016-09-18 MED ORDER — NAPROXEN 500 MG PO TABS: 500.0000 mg | ORAL_TABLET | Freq: Two times a day (BID) | ORAL | 1 refills | Status: DC

## 2016-09-18 MED ORDER — LOSARTAN POTASSIUM 100 MG PO TABS: 100.0000 mg | ORAL_TABLET | Freq: Every day | ORAL | 3 refills | Status: DC

## 2016-09-18 NOTE — Progress Notes (Signed)
Medication refills

## 2016-09-19 NOTE — Progress Notes (Signed)
Subjective:  Patient ID: Brandy Bond, female    DOB: 03/01/1951  Age: 65 y.o. MRN: [id number]  CC: Hypertension; Anorexia; and low potassium   HPI Brandy Bond presents To establish care here in the clinic.  She had presented to the ED last month with headaches and was found to have blood pressure 200/99 and was without a primary care physician. Commenced on lisinopril/HCTZ, placed on potassium pills for hypokalemia and subsequently discharged with recommendations to follow up here in the clinic.  Today she denies headaches but complains of intermittent left knee pain which is worsened by going up the stairs. She does not use any over-the-counter analgesics. Denies swelling of knees.  Past Medical History:  Diagnosis Date  . Hypertension     History reviewed. No pertinent surgical history.  Allergies  Allergen Reactions  . Penicillins     REACTION: itching     Outpatient Medications Prior to Visit  Medication Sig Dispense Refill  . Aspirin-Caffeine (BAYER BACK & BODY PAIN EX ST) 500-32.5 MG TABS Take 1 tablet by mouth as needed (for pain).    Marland Kitchen. lisinopril-hydrochlorothiazide (PRINZIDE,ZESTORETIC) 20-25 MG tablet Take 1 tablet by mouth daily. 30 tablet 0  . Iron-Vitamins (GERITOL COMPLETE PO) Take 1 tablet by mouth daily.      Marland Kitchen. amLODipine (NORVASC) 10 MG tablet Take 10 mg by mouth daily.    . naproxen sodium (ANAPROX) 220 MG tablet Take 220 mg by mouth daily as needed. pain     . oxyCODONE-acetaminophen (PERCOCET/ROXICET) 5-325 MG per tablet Take 1 tablet by mouth every 4 (four) hours as needed for severe pain. (Patient not taking: Reported on 09/18/2016) 15 tablet 0  . potassium chloride SA (K-DUR,KLOR-CON) 20 MEQ tablet Take 1 tablet (20 mEq total) by mouth daily. 7 tablet 0   No facility-administered medications prior to visit.     ROS Review of Systems  Constitutional: Negative for activity change and appetite change.  HENT: Negative for sinus pressure and sore  throat.   Respiratory: Negative for chest tightness, shortness of breath and wheezing.   Cardiovascular: Negative for chest pain and palpitations.  Gastrointestinal: Negative for abdominal distention, abdominal pain and constipation.  Genitourinary: Negative.   Musculoskeletal:       Occasional left knee pain   Psychiatric/Behavioral: Negative for behavioral problems and dysphoric mood.    Objective:  BP 135/86 (BP Location: Right Arm, Patient Position: Sitting, Cuff Size: Large)   Pulse 85   Temp 98.6 F (37 C) (Oral)   Ht 5\' 1"  (1.549 m)   Wt 123 lb 6.4 oz (56 kg)   SpO2 98%   BMI 23.32 kg/m   BP/Weight 09/18/2016 09/04/2016 08/11/2014  Systolic BP 135 200 172  Diastolic BP 86 99 93  Wt. (Lbs) 123.4 145 -  BMI 23.32 26.52 -      Physical Exam  Constitutional: She is oriented to person, place, and time. She appears well-developed and well-nourished.  Cardiovascular: Normal rate, normal heart sounds and intact distal pulses.   No murmur heard. Pulmonary/Chest: Effort normal and breath sounds normal. She has no wheezes. She has no rales. She exhibits no tenderness.  Abdominal: Soft. Bowel sounds are normal. She exhibits no distension and no mass. There is no tenderness.  Musculoskeletal: Normal range of motion. She exhibits no edema (no edema or tenderness on palpation or range of motion of both knees.).  Neurological: She is alert and oriented to person, place, and time.    CMP Latest Ref Rng &  Units 09/04/2016 04/22/2010  Glucose 65 - 99 mg/dL 814(G) 77  BUN 6 - 20 mg/dL 11 10  Creatinine 8.18 - 1.00 mg/dL 5.63 1.49  Sodium 702 - 145 mmol/L 143 140  Potassium 3.5 - 5.1 mmol/L 3.2(L) 2.7 RESULT REPEATED AND VERIFIED CRITICAL RESULT CALLED TO, READ BACK BY AND VERIFIED WITH: MCDONALD,M @ 1940 ON 04/22/10 BY LINTHICUM,L(LL)  Chloride 101 - 111 mmol/L 106 105  CO2 22 - 32 mmol/L 26 26  Calcium 8.9 - 10.3 mg/dL 9.3 8.8    Assessment & Plan:   1. Essential  hypertension Controlled Discontinue lisinopril/HCTZ due to cough Discontinue potassium Labs in 1 month to evaluate hypokalemia - losartan (COZAAR) 100 MG tablet; Take 1 tablet (100 mg total) by mouth daily.  Dispense: 30 tablet; Refill: 3  2. Chronic pain of left knee Advised to apply ice if symptoms persist - naproxen (NAPROSYN) 500 MG tablet; Take 1 tablet (500 mg total) by mouth 2 (two) times daily with a meal.  Dispense: 30 tablet; Refill: 1   Meds ordered this encounter  Medications  . losartan (COZAAR) 100 MG tablet    Sig: Take 1 tablet (100 mg total) by mouth daily.    Dispense:  30 tablet    Refill:  3  . naproxen (NAPROSYN) 500 MG tablet    Sig: Take 1 tablet (500 mg total) by mouth 2 (two) times daily with a meal.    Dispense:  30 tablet    Refill:  1    Follow-up: Return in about 1 month (around 10/19/2016) for Follow-up on hypertension.   Jaclyn Shaggy MD

## 2016-10-11 ENCOUNTER — Ambulatory Visit: Payer: Self-pay | Attending: Family Medicine | Admitting: Family Medicine

## 2016-10-11 ENCOUNTER — Encounter (HOSPITAL_COMMUNITY): Payer: Self-pay | Admitting: Emergency Medicine

## 2016-10-11 ENCOUNTER — Emergency Department (HOSPITAL_COMMUNITY)
Admission: EM | Admit: 2016-10-11 | Discharge: 2016-10-11 | Disposition: A | Discharge status: Home / Self Care | Payer: Self-pay | Attending: Emergency Medicine | Admitting: Emergency Medicine

## 2016-10-11 ENCOUNTER — Encounter: Payer: Self-pay | Admitting: Family Medicine

## 2016-10-11 VITALS — BP 193/100 | HR 90 | Temp 98.0°F | Ht 60.75 in | Wt 121.0 lb

## 2016-10-11 DIAGNOSIS — M25462 Effusion, left knee: Secondary | ICD-10-CM

## 2016-10-11 DIAGNOSIS — I10 Essential (primary) hypertension: Secondary | ICD-10-CM

## 2016-10-11 DIAGNOSIS — M1712 Unilateral primary osteoarthritis, left knee: Secondary | ICD-10-CM

## 2016-10-11 DIAGNOSIS — Z79899 Other long term (current) drug therapy: Secondary | ICD-10-CM | POA: Insufficient documentation

## 2016-10-11 DIAGNOSIS — Z7982 Long term (current) use of aspirin: Secondary | ICD-10-CM | POA: Insufficient documentation

## 2016-10-11 LAB — SYNOVIAL CELL COUNT + DIFF, W/ CRYSTALS
Crystals, Fluid: NONE SEEN
Lymphocytes-Synovial Fld: 6 % (ref 0–20)
Monocyte-Macrophage-Synovial Fluid: 3 % — ABNORMAL LOW (ref 50–90)
Neutrophil, Synovial: 91 % — ABNORMAL HIGH (ref 0–25)
WBC, Synovial: 14850 /mm3 — ABNORMAL HIGH (ref 0–200)

## 2016-10-11 LAB — LIPID PANEL
Cholesterol: 191 mg/dL (ref 125–200)
HDL: 53 mg/dL (ref >=46)
LDL CALC: 128 mg/dL (ref <130)
Total CHOL/HDL Ratio: 3.6 Ratio (ref <=5.0)
Triglycerides: 51 mg/dL (ref <150)
VLDL: 10 mg/dL (ref <30)

## 2016-10-11 LAB — CBC WITH DIFFERENTIAL/PLATELET
BASOS ABS: 100 {cells}/uL (ref 0–200)
Basophils Relative: 1 %
EOS PCT: 2 %
Eosinophils Absolute: 200 cells/uL (ref 15–500)
HCT: 34.9 % — ABNORMAL LOW (ref 35.0–45.0)
HEMOGLOBIN: 11.5 g/dL — AB (ref 11.7–15.5)
LYMPHS ABS: 2600 {cells}/uL (ref 850–3900)
LYMPHS PCT: 26 %
MCH: 29.8 pg (ref 27.0–33.0)
MCHC: 33 g/dL (ref 32.0–36.0)
MCV: 90.4 fL (ref 80.0–100.0)
MPV: 8.6 fL (ref 7.5–12.5)
Monocytes Absolute: 1200 cells/uL — ABNORMAL HIGH (ref 200–950)
Monocytes Relative: 12 %
NEUTROS PCT: 59 %
Neutro Abs: 5900 cells/uL (ref 1500–7800)
Platelets: 549 10*3/uL — ABNORMAL HIGH (ref 140–400)
RBC: 3.86 MIL/uL (ref 3.80–5.10)
RDW: 13.6 % (ref 11.0–15.0)
WBC: 10 10*3/uL (ref 3.8–10.8)

## 2016-10-11 LAB — COMPLETE METABOLIC PANEL WITH GFR
ALT: 16 U/L (ref 6–29)
AST: 21 U/L (ref 10–35)
Albumin: 3.6 g/dL (ref 3.6–5.1)
Alkaline Phosphatase: 96 U/L (ref 33–130)
BUN: 15 mg/dL (ref 7–25)
CO2: 27 mmol/L (ref 20–31)
Calcium: 9.3 mg/dL (ref 8.6–10.4)
Chloride: 102 mmol/L (ref 98–110)
Creat: 0.88 mg/dL (ref 0.50–0.99)
GFR, Est African American: 81 mL/min (ref >=60)
GFR, Est Non African American: 70 mL/min (ref >=60)
GLUCOSE: 97 mg/dL (ref 65–99)
POTASSIUM: 4.5 mmol/L (ref 3.5–5.3)
Sodium: 139 mmol/L (ref 135–146)
Total Bilirubin: 0.7 mg/dL (ref 0.2–1.2)
Total Protein: 7.3 g/dL (ref 6.1–8.1)

## 2016-10-11 LAB — CBG MONITORING, ED: Glucose-Capillary: 103 mg/dL — ABNORMAL HIGH (ref 65–99)

## 2016-10-11 MED ORDER — LIDOCAINE HCL 2 % IJ SOLN
20.0000 mL | Freq: Once | INTRAMUSCULAR | Status: AC
  Administered 2016-10-11: 400 mg
  Filled 2016-10-11: qty 20

## 2016-10-11 MED ORDER — HYDROCODONE-ACETAMINOPHEN 5-325 MG PO TABS: 2.0000 | ORAL_TABLET | ORAL | 0 refills | Status: DC | PRN

## 2016-10-11 NOTE — Patient Instructions (Signed)
Knee Effusion °Knee effusion means that you have excess fluid in your knee joint. This can cause pain and swelling in your knee. This may make your knee more difficult to bend and move. That is because there is increased pain and pressure in the joint. If there is fluid in your knee, it often means that something is wrong inside your knee, such as severe arthritis, abnormal inflammation, or an infection. Another common cause of knee effusion is an injury to the knee muscles, ligaments, or cartilage. °HOME CARE INSTRUCTIONS °· Use crutches as directed by your health care provider. °· Wear a knee brace as directed by your health care provider. °· Apply ice to the swollen area: °¨ Put ice in a plastic bag. °¨ Place a towel between your skin and the bag. °¨ Leave the ice on for 20 minutes, 2-3 times per day. °· Keep your knee raised (elevated) when you are sitting or lying down. °· Take medicines only as directed by your health care provider. °· Do any rehabilitation or strengthening exercises as directed by your health care provider. °· Rest your knee as directed by your health care provider. You may start doing your normal activities again when your health care provider approves.    °· Keep all follow-up visits as directed by your health care provider. This is important. °SEEK MEDICAL CARE IF: °· You have ongoing (persistent) pain in your knee. °SEEK IMMEDIATE MEDICAL CARE IF: °· You have increased swelling or redness of your knee. °· You have severe pain in your knee. °· You have a fever. °  °This information is not intended to replace advice given to you by your health care provider. Make sure you discuss any questions you have with your health care provider. °  °Document Released: 02/16/2004 Document Revised: 12/17/2014 Document Reviewed: 07/12/2014 °Elsevier Interactive Patient Education ©2016 Elsevier Inc. ° °

## 2016-10-11 NOTE — Discharge Instructions (Signed)
Please read attached information. If you experience any new or worsening signs or symptoms please return to the emergency room for evaluation. Please follow-up with your primary care provider or specialist as discussed. Please use medication prescribed only as directed and discontinue taking if you have any concerning signs or symptoms.   °

## 2016-10-11 NOTE — ED Triage Notes (Signed)
Pt sent from Dr. Felipa Furnace office. Pt seen for knee pain and hypertension. Pt told to follow up in three weeks with them for the hypertension. Pt sts Dr. Catalina Pizza her to come here to have fluid drained from her L knee. Pt dx with knee Effusion. Pt A&Ox4. Pt c/o fluid on the knee x 3 weeks. Pt c/o severe pain with weight bearing.

## 2016-10-11 NOTE — Progress Notes (Signed)
Subjective:  Patient ID: Brandy Bond, female    DOB: [birth year]  Age: 65 y.o. MRN: [id number]  CC: Hypertension and Knee Pain (left)   HPI Brandy Bond is a 65 year old female with a history of hypertension who presents today for follow-up visit. She was commenced on losartan at herl last office visit and reports that blood pressures at home are in the 120 to 130 range systolic but her blood pressure today is elevated due to her not taking her morning dose of antihypertensive as she is fasting in anticipation of labs.  Complains of left knee pain and swelling and had received naproxen at her last office visit which is not helping. She has been using a knee sleeve with no improvement in symptoms; swelling has worsened since her last office visit. Has had left knee arthrocentesis in the past. Knee x-ray from 04/2014 revealed posterior arthritic changes with associated large knee effusion  Past Medical History:  Diagnosis Date  . Hypertension     History reviewed. No pertinent surgical history.  Allergies  Allergen Reactions  . Penicillins Shortness Of Breath, Itching and Swelling    Has patient had a PCN reaction causing immediate rash, facial/tongue/throat swelling, SOB or lightheadedness with hypotension: yes Has patient had a PCN reaction causing severe rash involving mucus membranes or skin necrosis: no Has patient had a PCN reaction that required hospitalization: unknown Has patient had a PCN reaction occurring within the last 10 years: no If all of the above answers are "NO", then may proceed with Cephalosporin use.      Outpatient Medications Prior to Visit  Medication Sig Dispense Refill  . Iron-Vitamins (GERITOL COMPLETE PO) Take 1 tablet by mouth daily.      Marland Kitchen losartan (COZAAR) 100 MG tablet Take 1 tablet (100 mg total) by mouth daily. 30 tablet 3  . naproxen (NAPROSYN) 500 MG tablet Take 1 tablet (500 mg total) by mouth 2 (two) times daily with a meal. 30 tablet 1  .  Aspirin-Caffeine (BAYER BACK & BODY PAIN EX ST) 500-32.5 MG TABS Take 1 tablet by mouth as needed (for pain).     No facility-administered medications prior to visit.     ROS Review of Systems  Constitutional: Negative for activity change, appetite change and fatigue.  HENT: Negative for congestion, sinus pressure and sore throat.   Eyes: Negative for visual disturbance.  Respiratory: Negative for cough, chest tightness, shortness of breath and wheezing.   Cardiovascular: Negative for chest pain and palpitations.  Gastrointestinal: Negative for abdominal distention, abdominal pain and constipation.  Endocrine: Negative for polydipsia.  Genitourinary: Negative for dysuria and frequency.  Musculoskeletal:       See history of present illness  Skin: Negative for rash.  Neurological: Negative for tremors, light-headedness and numbness.  Hematological: Does not bruise/bleed easily.  Psychiatric/Behavioral: Negative for agitation and behavioral problems.    Objective:  BP (!) 193/100 (BP Location: Right Arm, Patient Position: Sitting, Cuff Size: Small)   Pulse 90   Temp 98 F (36.7 C) (Oral)   Ht 5' 0.75" (1.543 m)   Wt 121 lb (54.9 kg)   SpO2 98%   BMI 23.05 kg/m   BP/Weight 10/11/2016 09/18/2016 09/04/2016  Systolic BP 193 135 200  Diastolic BP 100 86 99  Wt. (Lbs) 121 123.4 145  BMI 23.05 23.32 26.52      Physical Exam  Constitutional: She is oriented to person, place, and time. She appears well-developed and well-nourished.  Cardiovascular: Normal rate, normal  heart sounds and intact distal pulses.   No murmur heard. Pulmonary/Chest: Effort normal and breath sounds normal. She has no wheezes. She has no rales. She exhibits no tenderness.  Abdominal: Soft. Bowel sounds are normal. She exhibits no distension and no mass. There is no tenderness.  Musculoskeletal: Normal range of motion. She exhibits edema (edema of left knee) and tenderness (Tenderness to palpation and range  of left knee).  Neurological: She is alert and oriented to person, place, and time.    CLINICAL DATA:  LEFT knee pain and swelling for 2 days, no known injury  EXAM: LEFT KNEE - COMPLETE 4+ VIEW  COMPARISON:  None  FINDINGS: Diffuse osseous demineralization.  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  No acute fracture, dislocation or bone destruction.  Large knee joint effusion.  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  Osseous demineralization.   Electronically Signed   By: Ulyses SouthwardMark  Boles M.D.   On: 04/20/2014 10:09  Assessment & Plan:   1. Essential hypertension Uncontrolled due to not taking antihypertensive this morning. Advised to take losartan pill while in the clinic - Lipid panel - COMPLETE METABOLIC PANEL WITH GFR  2. Effusion of left knee Will need to exclude underlying infection versus inflammation Patient will need to have arthrocentesis - will present to the ED - CBC with Differential/Platelet - Uric Acid   No orders of the defined types were placed in this encounter.   Follow-up: Return in about 3 weeks (around 11/01/2016) for follow up of hypertension.   Jaclyn ShaggyEnobong Amao MD

## 2016-10-11 NOTE — ED Notes (Signed)
Pt comes in today with a c/o left knee pain. Pt states the pain has been ongoing for 3 weeks. Pt does not have an orthopedic MD. She did however, see here PCP today who sent her over here to the ED to have fluid removed from her knee.

## 2016-10-11 NOTE — ED Notes (Addendum)
Pt had CMP, CBC, and uric acid levels drawn at Drs office. No new imaging noted, pt had xray done in May that showed Left knee joint effusion.

## 2016-10-11 NOTE — Progress Notes (Signed)
Fasting for blood work.

## 2016-10-11 NOTE — ED Provider Notes (Signed)
[facility]-EMERGENCY DEPT Provider Note   CSN: [id number] Arrival date & time: 10/11/16  1144     History   Chief Complaint Chief Complaint  Patient presents with  . Knee Pain    HPI Brandy Bond is a 65 y.o. female.  HPI   65 year old female presents today at the request of primary care for arthrocentesis. She reports 3 week history of progressive left knee pain, history of arthritis in the knee. She denies any fever or chills, or any other infectious etiology. She denies any trauma to the knee. She reports a history of the same. No history of gout.  Past Medical History:  Diagnosis Date  . Hypertension     Patient Active Problem List   Diagnosis Date Noted  . Osteoarthritis of left knee 10/11/2016  . Hypertension 09/18/2016  . CONSTIPATION, CHRONIC 10/11/2010    History reviewed. No pertinent surgical history.  OB History    No data available       Home Medications    Prior to Admission medications   Medication Sig Start Date End Date Taking? Authorizing Provider  Aspirin-Caffeine (BAYER BACK & BODY PAIN EX ST) 500-32.5 MG TABS Take 1 tablet by mouth as needed (for pain).   Yes Historical Provider, MD  Iron-Vitamins (GERITOL COMPLETE PO) Take 1 tablet by mouth daily.     Yes Historical Provider, MD  losartan (COZAAR) 100 MG tablet Take 1 tablet (100 mg total) by mouth daily. 09/18/16  Yes Jaclyn Shaggy, MD  naproxen (NAPROSYN) 500 MG tablet Take 1 tablet (500 mg total) by mouth 2 (two) times daily with a meal. Patient taking differently: Take 500 mg by mouth 2 (two) times daily as needed for moderate pain.  09/18/16  Yes Jaclyn Shaggy, MD  HYDROcodone-acetaminophen (NORCO/VICODIN) 5-325 MG tablet Take 2 tablets by mouth every 4 (four) hours as needed. 10/11/16   Eyvonne Mechanic, PA-C    Family History No family history on file.  Social History Social History  Substance Use Topics  . Smoking status: Never Smoker  . Smokeless tobacco: Never Used  . Alcohol use 1.2  - 1.8 oz/week    2 - 3 Cans of beer per week     Comment: occasionally     Allergies   Penicillins   Review of Systems Review of Systems  All other systems reviewed and are negative.    Physical Exam Updated Vital Signs BP 151/90   Pulse 85   Temp 97.9 F (36.6 C) (Oral)   Resp 18   SpO2 100%   Physical Exam  Musculoskeletal:  Joint effusion left knee, no warmth to touch, no surrounding redness. Decreased range of motion. Distal sensation intact.  Nursing note and vitals reviewed.    ED Treatments / Results  Labs (all labs ordered are listed, but only abnormal results are displayed) Labs Reviewed  SYNOVIAL CELL COUNT + DIFF, W/ CRYSTALS - Abnormal; Notable for the following:       Result Value   Appearance-Synovial TURBID (*)    WBC, Synovial 14,850 (*)    Neutrophil, Synovial 91 (*)    Monocyte-Macrophage-Synovial Fluid 3 (*)    All other components within normal limits  CBG MONITORING, ED - Abnormal; Notable for the following:    Glucose-Capillary 103 (*)    All other components within normal limits  BODY FLUID CULTURE  MISC [facility] TEST (SEND OUT)  MISC [facility] TEST (SEND OUT)    EKG  EKG Interpretation None  Radiology No results found.  Procedures Procedures (including critical care time)    After consent was obtained, using sterile technique the knee was prepped and plain Lidocaine 2% was used as local anesthetic. The joint was entered and 15 ml's of clear colored fluid was withdrawn and sent for analysis.  .  The procedure was well tolerated.  The patient is asked to continue to rest the joint for a few more days before resuming regular activities.  It may be more painful for the first 1-2 days.  Watch for fever, or increased swelling or persistent pain in the joint. Call or return to clinic prn if such symptoms occur or there is failure to improve as anticipated.  Medications Ordered in ED Medications  lidocaine (XYLOCAINE) 2 % (with  pres) injection 400 mg (400 mg Infiltration Given by Other 10/11/16 1234)     Initial Impression / Assessment and Plan / ED Course  I have reviewed the triage vital signs and the nursing notes.  Pertinent labs & imaging results that were available during my care of the patient were reviewed by me and considered in my medical decision making (see chart for details).  Clinical Course   Patient presents with request of primary care for arthrocentesis. Her exam is not concerning for infectious etiology, she has no fever, chills, warmth to touch, or surrounding redness. Fluid analysis was clear, laboratory analysis showed no signs of significant infection, low suspicion for septic arthritis in this patient. Patient will be encouraged follow-up with orthopedics, her primary care for reevaluation. Symptoms improved after arthrocentesis here. Strict return precautions given. Patient verbalized understanding and agreement to today's plan had no further questions concerns.  Final Clinical Impressions(s) / ED Diagnoses   Final diagnoses:  Effusion of left knee    New Prescriptions Discharge Medication List as of 10/11/2016  3:06 PM    START taking these medications   Details  HYDROcodone-acetaminophen (NORCO/VICODIN) 5-325 MG tablet Take 2 tablets by mouth every 4 (four) hours as needed., Starting Thu 10/11/2016, Print         Eyvonne MechanicJeffrey Alvis Pulcini, PA-C 10/11/16 1759    Linwood DibblesJon Knapp, MD 10/12/16 [phone]

## 2016-10-12 ENCOUNTER — Telehealth: Payer: Self-pay

## 2016-10-12 LAB — MISC LABCORP TEST (SEND OUT)
LABCORP TEST CODE: 19588
LabCorp test name: 82945
LabCorp test name: 84157
Labcorp test code: 19497

## 2016-10-12 LAB — URIC ACID: Uric Acid, Serum: 3.6 mg/dL (ref 2.5–7.0)

## 2016-10-12 NOTE — Telephone Encounter (Signed)
-----   Message from Jaclyn Shaggy, MD sent at 10/12/2016  3:06 PM EDT ----- Labs revealed mild anemia. Encourage intake of iron rich foods.

## 2016-10-12 NOTE — Telephone Encounter (Signed)
Writer called to discuss lab results and spoke with patient's sister who was with her at her visit yesterday.  She stated understanding and will share information with patient.

## 2016-10-14 LAB — BODY FLUID CULTURE: Culture: NO GROWTH

## 2016-11-07 ENCOUNTER — Encounter: Payer: Self-pay | Admitting: Family Medicine

## 2016-11-07 ENCOUNTER — Ambulatory Visit: Payer: Self-pay | Attending: Family Medicine | Admitting: Family Medicine

## 2016-11-07 VITALS — BP 137/83 | HR 86 | Temp 98.6°F | Ht 61.0 in | Wt 118.2 lb

## 2016-11-07 DIAGNOSIS — I10 Essential (primary) hypertension: Secondary | ICD-10-CM | POA: Insufficient documentation

## 2016-11-07 DIAGNOSIS — M1712 Unilateral primary osteoarthritis, left knee: Secondary | ICD-10-CM | POA: Insufficient documentation

## 2016-11-07 DIAGNOSIS — R634 Abnormal weight loss: Secondary | ICD-10-CM | POA: Insufficient documentation

## 2016-11-07 LAB — COMPLETE METABOLIC PANEL WITH GFR
ALT: 11 U/L (ref 6–29)
AST: 15 U/L (ref 10–35)
Albumin: 3.3 g/dL — ABNORMAL LOW (ref 3.6–5.1)
Alkaline Phosphatase: 89 U/L (ref 33–130)
BILIRUBIN TOTAL: 0.5 mg/dL (ref 0.2–1.2)
BUN: 14 mg/dL (ref 7–25)
CHLORIDE: 101 mmol/L (ref 98–110)
CO2: 29 mmol/L (ref 20–31)
CREATININE: 0.79 mg/dL (ref 0.50–0.99)
Calcium: 9 mg/dL (ref 8.6–10.4)
GFR, EST NON AFRICAN AMERICAN: 80 mL/min (ref >=60)
GFR, Est African American: >89 mL/min (ref >=60)
GLUCOSE: 92 mg/dL (ref 65–99)
Potassium: 4.4 mmol/L (ref 3.5–5.3)
SODIUM: 138 mmol/L (ref 135–146)
TOTAL PROTEIN: 6.7 g/dL (ref 6.1–8.1)

## 2016-11-07 LAB — TSH: TSH: 1.18 m[IU]/L

## 2016-11-07 NOTE — Progress Notes (Signed)
Subjective:  Patient ID: Brandy Bond, female    DOB: 07/30/1951  Age: 65 y.o. MRN: [id number]  CC: Hypertension   HPI Brandy BileCecelia Palma presents for Follow-up of hypertension after a change in blood pressure regimen at her last office visit due to cough associated with lisinopril. Her blood pressure is controlled today.  She is also status post left knee arthrocentesis at the ED and again by orthopedics with a follow-up with orthopedics in 3 months. She states that her left knee is doing much better.  She is concerned of weight loss despite a good appetite. She has lost 2 pounds in the last 3 weeks. She has not had a Pap smear, mammogram or colonoscopy and informs me she is unable to have this as her "papers are not straight". Denies night sweats, tremors and diarrhea.  Past Medical History:  Diagnosis Date  . Hypertension     History reviewed. No pertinent surgical history.    Allergies  Allergen Reactions  . Penicillins Shortness Of Breath, Itching and Swelling    Has patient had a PCN reaction causing immediate rash, facial/tongue/throat swelling, SOB or lightheadedness with hypotension: yes Has patient had a PCN reaction causing severe rash involving mucus membranes or skin necrosis: no Has patient had a PCN reaction that required hospitalization: unknown Has patient had a PCN reaction occurring within the last 10 years: no If all of the above answers are "NO", then may proceed with Cephalosporin use.       Outpatient Medications Prior to Visit  Medication Sig Dispense Refill  . Aspirin-Caffeine (BAYER BACK & BODY PAIN EX ST) 500-32.5 MG TABS Take 1 tablet by mouth as needed (for pain).    . Iron-Vitamins (GERITOL COMPLETE PO) Take 1 tablet by mouth daily.      Marland Kitchen. losartan (COZAAR) 100 MG tablet Take 1 tablet (100 mg total) by mouth daily. 30 tablet 3  . HYDROcodone-acetaminophen (NORCO/VICODIN) 5-325 MG tablet Take 2 tablets by mouth every 4 (four) hours as needed. (Patient  not taking: Reported on 11/07/2016) 10 tablet 0  . naproxen (NAPROSYN) 500 MG tablet Take 1 tablet (500 mg total) by mouth 2 (two) times daily with a meal. (Patient not taking: Reported on 11/07/2016) 30 tablet 1   No facility-administered medications prior to visit.     ROS Review of Systems  Constitutional: Positive for unexpected weight change. Negative for activity change, appetite change and fatigue.  HENT: Negative for congestion, sinus pressure and sore throat.   Eyes: Negative for visual disturbance.  Respiratory: Negative for cough, chest tightness, shortness of breath and wheezing.   Cardiovascular: Negative for chest pain and palpitations.  Gastrointestinal: Negative for abdominal distention, abdominal pain and constipation.  Endocrine: Negative for polydipsia.  Genitourinary: Negative for dysuria and frequency.  Musculoskeletal:       See history of present illness  Skin: Negative for rash.  Neurological: Negative for tremors, light-headedness and numbness.  Hematological: Does not bruise/bleed easily.  Psychiatric/Behavioral: Negative for agitation and behavioral problems.    Objective:  BP 137/83 (BP Location: Right Arm, Patient Position: Sitting, Cuff Size: Small)   Pulse 86   Temp 98.6 F (37 C) (Oral)   Ht 5\' 1"  (1.549 m)   Wt 118 lb 3.2 oz (53.6 kg)   SpO2 99%   BMI 22.33 kg/m   BP/Weight 11/07/2016 10/11/2016 10/11/2016  Systolic BP 137 151 193  Diastolic BP 83 90 100  Wt. (Lbs) 118.2 - 121  BMI 22.33 - 23.05  Physical Exam  Constitutional: She is oriented to person, place, and time. She appears well-developed and well-nourished.  Cardiovascular: Normal rate, normal heart sounds and intact distal pulses.   No murmur heard. Pulmonary/Chest: Effort normal and breath sounds normal. She has no wheezes. She has no rales. She exhibits no tenderness.  Abdominal: Soft. Bowel sounds are normal. She exhibits no distension and no mass. There is no  tenderness.  Musculoskeletal: She exhibits edema (LEFT knee) and tenderness (Mild tenderness on range of motion).  Neurological: She is alert and oriented to person, place, and time.     Assessment & Plan:   1. Loss of weight Advised to speak with the front desk regarding financial application for [facility] Would love to screen for breast cancer, cervical and colonoscopy - TSH  2. Essential hypertension Controlled Low-sodium diet - COMPLETE METABOLIC PANEL WITH GFR  3. Primary osteoarthritis of left knee Status post left knee arthrocentesis Continue NSAIDs Apply Ace wrap Follow-up with orthopedics  No orders of the defined types were placed in this encounter.   Follow-up: Return in about 3 months (around 02/06/2017) for follow up on Hypertension.   Jaclyn Shaggy MD

## 2016-11-07 NOTE — Patient Instructions (Signed)
Hypertension Hypertension, commonly called high blood pressure, is when the force of blood pumping through your arteries is too strong. Your arteries are the blood vessels that carry blood from your heart throughout your body. A blood pressure reading consists of a higher number over a lower number, such as 110/72. The higher number (systolic) is the pressure inside your arteries when your heart pumps. The lower number (diastolic) is the pressure inside your arteries when your heart relaxes. Ideally you want your blood pressure below 120/80. Hypertension forces your heart to work harder to pump blood. Your arteries may become narrow or stiff. Having untreated or uncontrolled hypertension can cause heart attack, stroke, kidney disease, and other problems. What increases the risk? Some risk factors for high blood pressure are controllable. Others are not. Risk factors you cannot control include:  Race. You may be at higher risk if you are African American.  Age. Risk increases with age.  Gender. Men are at higher risk than women before age 45 years. After age 65, women are at higher risk than men. Risk factors you can control include:  Not getting enough exercise or physical activity.  Being overweight.  Getting too much fat, sugar, calories, or salt in your diet.  Drinking too much alcohol. What are the signs or symptoms? Hypertension does not usually cause signs or symptoms. Extremely high blood pressure (hypertensive crisis) may cause headache, anxiety, shortness of breath, and nosebleed. How is this diagnosed? To check if you have hypertension, your health care provider will measure your blood pressure while you are seated, with your arm held at the level of your heart. It should be measured at least twice using the same arm. Certain conditions can cause a difference in blood pressure between your right and left arms. A blood pressure reading that is higher than normal on one occasion does  not mean that you need treatment. If it is not clear whether you have high blood pressure, you may be asked to return on a different day to have your blood pressure checked again. Or, you may be asked to monitor your blood pressure at home for 1 or more weeks. How is this treated? Treating high blood pressure includes making lifestyle changes and possibly taking medicine. Living a healthy lifestyle can help lower high blood pressure. You may need to change some of your habits. Lifestyle changes may include:  Following the DASH diet. This diet is high in fruits, vegetables, and whole grains. It is low in salt, red meat, and added sugars.  Keep your sodium intake below 2,300 mg per day.  Getting at least 30-45 minutes of aerobic exercise at least 4 times per week.  Losing weight if necessary.  Not smoking.  Limiting alcoholic beverages.  Learning ways to reduce stress. Your health care provider may prescribe medicine if lifestyle changes are not enough to get your blood pressure under control, and if one of the following is true:  You are 18-59 years of age and your systolic blood pressure is above 140.  You are 60 years of age or older, and your systolic blood pressure is above 150.  Your diastolic blood pressure is above 90.  You have diabetes, and your systolic blood pressure is over 140 or your diastolic blood pressure is over 90.  You have kidney disease and your blood pressure is above 140/90.  You have heart disease and your blood pressure is above 140/90. Your personal target blood pressure may vary depending on your medical   conditions, your age, and other factors. Follow these instructions at home:  Have your blood pressure rechecked as directed by your health care provider.  Take medicines only as directed by your health care provider. Follow the directions carefully. Blood pressure medicines must be taken as prescribed. The medicine does not work as well when you skip  doses. Skipping doses also puts you at risk for problems.  Do not smoke.  Monitor your blood pressure at home as directed by your health care provider. Contact a health care provider if:  You think you are having a reaction to medicines taken.  You have recurrent headaches or feel dizzy.  You have swelling in your ankles.  You have trouble with your vision. Get help right away if:  You develop a severe headache or confusion.  You have unusual weakness, numbness, or feel faint.  You have severe chest or abdominal pain.  You vomit repeatedly.  You have trouble breathing. This information is not intended to replace advice given to you by your health care provider. Make sure you discuss any questions you have with your health care provider. Document Released: 11/26/2005 Document Revised: 05/03/2016 Document Reviewed: 09/18/2013 Elsevier Interactive Patient Education  2017 Elsevier Inc.  

## 2016-11-08 ENCOUNTER — Telehealth: Payer: Self-pay

## 2016-11-08 NOTE — Telephone Encounter (Signed)
Writer called and LVM requesting patient to call back to discuss lab results. 

## 2016-11-08 NOTE — Telephone Encounter (Signed)
-----   Message from Enobong Amao, MD sent at 11/08/2016  1:43 PM EST ----- Please inform the patient that labs are normal. Thank you. 

## 2016-11-12 ENCOUNTER — Telehealth: Payer: Self-pay | Admitting: Family Medicine

## 2016-11-12 NOTE — Telephone Encounter (Signed)
Patient is aware that the labs are normal. Please follow up.

## 2016-11-13 ENCOUNTER — Telehealth: Payer: Self-pay

## 2016-11-13 NOTE — Telephone Encounter (Signed)
Writer spoke with patient's sister and discussed lab results.  Patient's sisiter states understanding and will share the results with patient.

## 2016-11-13 NOTE — Telephone Encounter (Signed)
Writer spoke with patient this morning she did not mention needing a medication refill.  Patient is aware of lab results.

## 2016-11-13 NOTE — Telephone Encounter (Signed)
-----   Message from Jaclyn Shaggy, MD sent at 11/08/2016  1:43 PM EST ----- Please inform the patient that labs are normal. Thank you.

## 2017-01-16 ENCOUNTER — Emergency Department (HOSPITAL_COMMUNITY)
Admission: EM | Admit: 2017-01-16 | Discharge: 2017-01-16 | Disposition: A | Discharge status: Home / Self Care | Payer: Self-pay | Attending: Emergency Medicine | Admitting: Emergency Medicine

## 2017-01-16 ENCOUNTER — Encounter (HOSPITAL_COMMUNITY): Payer: Self-pay | Admitting: Emergency Medicine

## 2017-01-16 ENCOUNTER — Emergency Department (HOSPITAL_BASED_OUTPATIENT_CLINIC_OR_DEPARTMENT_OTHER)
Admit: 2017-01-16 | Discharge: 2017-01-16 | Disposition: A | Discharge status: Home / Self Care | Payer: Self-pay | Attending: Emergency Medicine | Admitting: Emergency Medicine

## 2017-01-16 DIAGNOSIS — I10 Essential (primary) hypertension: Secondary | ICD-10-CM | POA: Insufficient documentation

## 2017-01-16 DIAGNOSIS — Z7982 Long term (current) use of aspirin: Secondary | ICD-10-CM | POA: Insufficient documentation

## 2017-01-16 DIAGNOSIS — M25462 Effusion, left knee: Secondary | ICD-10-CM | POA: Insufficient documentation

## 2017-01-16 DIAGNOSIS — M7989 Other specified soft tissue disorders: Secondary | ICD-10-CM

## 2017-01-16 DIAGNOSIS — Z79899 Other long term (current) drug therapy: Secondary | ICD-10-CM | POA: Insufficient documentation

## 2017-01-16 MED ORDER — HYDROCODONE-ACETAMINOPHEN 5-325 MG PO TABS
1.0000 | ORAL_TABLET | Freq: Once | ORAL | Status: AC
  Administered 2017-01-16: 1 via ORAL
  Filled 2017-01-16: qty 1

## 2017-01-16 MED ORDER — ACETAMINOPHEN 500 MG PO TABS
1000.0000 mg | ORAL_TABLET | Freq: Once | ORAL | Status: AC
  Administered 2017-01-16: 1000 mg via ORAL
  Filled 2017-01-16: qty 2

## 2017-01-16 MED ORDER — ACETAMINOPHEN 500 MG PO TABS: 1000.0000 mg | ORAL_TABLET | Freq: Three times a day (TID) | ORAL | 0 refills | Status: AC

## 2017-01-16 NOTE — ED Provider Notes (Addendum)
[facility]-EMERGENCY DEPT Provider Note   CSN: [id number] Arrival date & time: 01/16/17  0919     History   Chief Complaint Chief Complaint  Patient presents with  . Leg Swelling    HPI Brandy Bond is a 66 y.o. female.  The history is provided by the patient.  Leg Pain   This is a recurrent problem. Episode onset: about 1 yr. Episode frequency: intermittent. Progression since onset: fluctuating. The pain is present in the left lower leg. The pain is moderate. Associated symptoms comments: Knee effusion and swelling in posterior knee . She has tried OTC pain medications for the symptoms. The treatment provided mild relief. OA of left knee     Past Medical History:  Diagnosis Date  . Hypertension     Patient Active Problem List   Diagnosis Date Noted  . Osteoarthritis of left knee 10/11/2016  . Hypertension 09/18/2016  . CONSTIPATION, CHRONIC 10/11/2010    History reviewed. No pertinent surgical history.  OB History    No data available       Home Medications    Prior to Admission medications   Medication Sig Start Date End Date Taking? Authorizing Provider  Iron-Vitamins (GERITOL COMPLETE PO) Take 1 tablet by mouth daily.     Yes Historical Provider, MD  losartan (COZAAR) 100 MG tablet Take 1 tablet (100 mg total) by mouth daily. 09/18/16  Yes Jaclyn Shaggy, MD  acetaminophen (TYLENOL) 500 MG tablet Take 2 tablets (1,000 mg total) by mouth every 8 (eight) hours. Do not take more than 4000 mg of acetaminophen (Tylenol) in a 24-hour period. Please note that other medicines that you may be prescribed may have Tylenol as well. 01/16/17 01/21/17  Nira Conn, MD  Aspirin-Caffeine (BAYER BACK & BODY PAIN EX ST) 500-32.5 MG TABS Take 1 tablet by mouth as needed (for pain).    Historical Provider, MD  HYDROcodone-acetaminophen (NORCO/VICODIN) 5-325 MG tablet Take 2 tablets by mouth every 4 (four) hours as needed. Patient not taking: Reported on 11/07/2016 10/11/16    Eyvonne Mechanic, PA-C  naproxen (NAPROSYN) 500 MG tablet Take 1 tablet (500 mg total) by mouth 2 (two) times daily with a meal. Patient not taking: Reported on 11/07/2016 09/18/16   Jaclyn Shaggy, MD    Family History No family history on file.  Social History Social History  Substance Use Topics  . Smoking status: Never Smoker  . Smokeless tobacco: Never Used  . Alcohol use 1.2 - 1.8 oz/week    2 - 3 Cans of beer per week     Comment: occasionally     Allergies   Penicillins   Review of Systems Review of Systems Ten systems are reviewed and are negative for acute change except as noted in the HPI   Physical Exam Updated Vital Signs BP (!) 173/102   Pulse 64   Temp 97.8 F (36.6 C) (Oral)   Resp 16   SpO2 99%   Physical Exam  Constitutional: She is oriented to person, place, and time. She appears well-developed and well-nourished. No distress.  HENT:  Head: Normocephalic and atraumatic.  Right Ear: External ear normal.  Left Ear: External ear normal.  Nose: Nose normal.  Eyes: Conjunctivae and EOM are normal. No scleral icterus.  Neck: Normal range of motion and phonation normal.  Cardiovascular: Normal rate and regular rhythm.   Pulmonary/Chest: Effort normal. No stridor. No respiratory distress.  Abdominal: She exhibits no distension.  Musculoskeletal: Normal range of motion. She exhibits no  edema.       Left knee: She exhibits swelling and effusion.       Legs: Neurological: She is alert and oriented to person, place, and time.  Skin: She is not diaphoretic.  Psychiatric: She has a normal mood and affect. Her behavior is normal.  Vitals reviewed.    ED Treatments / Results  Labs (all labs ordered are listed, but only abnormal results are displayed) Labs Reviewed - No data to display  EKG  EKG Interpretation None       Radiology No results found.  Procedures .Joint Aspiration/Arthrocentesis Date/Time: 01/16/2017 5:21 PM Performed by: Nira Conn Authorized by: Nira Conn   Consent:    Consent obtained:  Verbal   Consent given by:  Patient   Risks discussed:  Bleeding, infection and nerve damage   Alternatives discussed:  No treatment Location:    Location:  Knee   Knee:  L knee Anesthesia (see MAR for exact dosages):    Anesthesia method:  None Procedure details:    Needle gauge:  18 G   Ultrasound guidance: no     Approach:  Lateral   Aspirate amount:  20cc   Aspirate characteristics:  Blood-tinged   Steroid injected: no     Specimen collected: no   Post-procedure details:    Patient tolerance of procedure:  Tolerated well, no immediate complications   (including critical care time)  Medications Ordered in ED Medications  acetaminophen (TYLENOL) tablet 1,000 mg (1,000 mg Oral Given 01/16/17 1241)  HYDROcodone-acetaminophen (NORCO/VICODIN) 5-325 MG per tablet 1 tablet (1 tablet Oral Given 01/16/17 1645)     Initial Impression / Assessment and Plan / ED Course  I have reviewed the triage vital signs and the nursing notes.  Pertinent labs & imaging results that were available during my care of the patient were reviewed by me and considered in my medical decision making (see chart for details).     Effusion likely secondary to OA. Low suspicion for septic arthritis. [place] without DVT. Possible baker's cyst. Therapeutic arthrocentesis performed as above with minimal fluid obtained. Will have pt follow up with PCP and ortho for further management.  The patient is safe for discharge with strict return precautions.  Final Clinical Impressions(s) / ED Diagnoses   Final diagnoses:  Knee effusion, left   Disposition: Discharge  Condition: Good  I have discussed the results, Dx and Tx plan with the patient who expressed understanding and agree(s) with the plan. Discharge instructions discussed at great length. The patient was given strict return precautions who verbalized understanding of the  instructions. No further questions at time of discharge.    New Prescriptions   ACETAMINOPHEN (TYLENOL) 500 MG TABLET    Take 2 tablets (1,000 mg total) by mouth every 8 (eight) hours. Do not take more than 4000 mg of acetaminophen (Tylenol) in a 24-hour period. Please note that other medicines that you may be prescribed may have Tylenol as well.    Follow Up: Jaclyn Shaggy, MD [address] [place] [address] [phone]  Schedule an appointment as soon as possible for a visit in 2 days For close follow up to assess for knee swelling      Nira Conn, MD 01/16/17 1721    Nira Conn, MD 01/16/17 [phone]

## 2017-01-16 NOTE — ED Notes (Signed)
Patient and family requesting update from MD. MD made aware.

## 2017-01-16 NOTE — ED Triage Notes (Signed)
Pt has plum-sized lump behind left knee; pt has had fluid drained from knee in the past and lump returned a month ago; pt able to ambulate with some difficulty

## 2017-01-16 NOTE — Progress Notes (Signed)
*  PRELIMINARY RESULTS* Vascular Ultrasound Left lower extremity venous duplex has been completed.  Preliminary findings: No evidence of deep vein thrombosis in the visualized veins of the left lower extremity.  Two heterogeneous areas seen in the popliteal fossa one medial (largest) and one lateral.  Areas are palpable and do not appear to connect.   Brandy Bond 01/16/2017, 2:03 PM

## 2017-02-12 ENCOUNTER — Other Ambulatory Visit: Payer: Self-pay | Admitting: Family Medicine

## 2017-02-12 DIAGNOSIS — I10 Essential (primary) hypertension: Secondary | ICD-10-CM

## 2017-03-15 ENCOUNTER — Other Ambulatory Visit: Payer: Self-pay | Admitting: Family Medicine

## 2017-03-15 DIAGNOSIS — I10 Essential (primary) hypertension: Secondary | ICD-10-CM

## 2017-04-15 ENCOUNTER — Ambulatory Visit: Payer: Self-pay | Admitting: Family Medicine

## 2017-04-15 ENCOUNTER — Other Ambulatory Visit: Payer: Self-pay | Admitting: Family Medicine

## 2017-04-15 DIAGNOSIS — I10 Essential (primary) hypertension: Secondary | ICD-10-CM

## 2017-05-08 ENCOUNTER — Encounter: Payer: Self-pay | Admitting: Family Medicine

## 2017-05-08 ENCOUNTER — Ambulatory Visit: Payer: Self-pay | Attending: Family Medicine | Admitting: Family Medicine

## 2017-05-08 VITALS — BP 180/92 | HR 70 | Temp 97.9°F | Wt 132.4 lb

## 2017-05-08 DIAGNOSIS — Z1159 Encounter for screening for other viral diseases: Secondary | ICD-10-CM

## 2017-05-08 DIAGNOSIS — Z7982 Long term (current) use of aspirin: Secondary | ICD-10-CM | POA: Insufficient documentation

## 2017-05-08 DIAGNOSIS — Z880 Allergy status to penicillin: Secondary | ICD-10-CM | POA: Insufficient documentation

## 2017-05-08 DIAGNOSIS — M1712 Unilateral primary osteoarthritis, left knee: Secondary | ICD-10-CM

## 2017-05-08 DIAGNOSIS — I10 Essential (primary) hypertension: Secondary | ICD-10-CM

## 2017-05-08 MED ORDER — CLONIDINE HCL 0.1 MG PO TABS
0.1000 mg | ORAL_TABLET | Freq: Once | ORAL | Status: AC
  Administered 2017-05-08: 0.1 mg via ORAL

## 2017-05-08 MED ORDER — LOSARTAN POTASSIUM 100 MG PO TABS: ORAL_TABLET | ORAL | 6 refills | Status: DC

## 2017-05-08 NOTE — Progress Notes (Signed)
Pt has not has BP medication since Friday.

## 2017-05-08 NOTE — Progress Notes (Signed)
Subjective:  Patient ID: Brandy Bond, female    DOB: [birth year]  Age: 66 y.o. MRN: [id number]  CC: Hypertension   HPI Brandy Bond is a 66 year old female with a history of hypertension, left knee osteoarthritis who presents today for a follow-up visit.  She has been out of her antihypertensives hence her elevated blood pressure; Her blood pressure is 191/92 and clonidine 0.1 mg was administered which she tolerated while patient observed for 30 minutes in the clinic prior to repeat blood pressure. She does not exercise and is not compliant with a low-sodium diet.  She does have intermittent left knee pain for which she takes Aleve; she is status post left thoracentesis of the left knee by orthopedics in the fall of last year.  Past Medical History:  Diagnosis Date  . Hypertension     No past surgical history on file.  Allergies  Allergen Reactions  . Penicillins Shortness Of Breath, Itching and Swelling    Has patient had a PCN reaction causing immediate rash, facial/tongue/throat swelling, SOB or lightheadedness with hypotension: yes Has patient had a PCN reaction causing severe rash involving mucus membranes or skin necrosis: no Has patient had a PCN reaction that required hospitalization: unknown Has patient had a PCN reaction occurring within the last 10 years: no If all of the above answers are "NO", then may proceed with Cephalosporin use.      Outpatient Medications Prior to Visit  Medication Sig Dispense Refill  . Aspirin-Caffeine (BAYER BACK & BODY PAIN EX ST) 500-32.5 MG TABS Take 1 tablet by mouth as needed (for pain).    . Iron-Vitamins (GERITOL COMPLETE PO) Take 1 tablet by mouth daily.      Marland Kitchen losartan (COZAAR) 100 MG tablet TAKE 1 TABLET BY MOUTH ONCE DAILY **MUST  HAVE  OFFICE  VISIT  FOR  REFILLS** 30 tablet 0  . naproxen (NAPROSYN) 500 MG tablet Take 1 tablet (500 mg total) by mouth 2 (two) times daily with a meal. (Patient not taking: Reported on 11/07/2016)  30 tablet 1  . HYDROcodone-acetaminophen (NORCO/VICODIN) 5-325 MG tablet Take 2 tablets by mouth every 4 (four) hours as needed. (Patient not taking: Reported on 11/07/2016) 10 tablet 0   No facility-administered medications prior to visit.     ROS Review of Systems  Constitutional: Negative for activity change, appetite change and fatigue.  HENT: Negative for congestion, sinus pressure and sore throat.   Eyes: Negative for visual disturbance.  Respiratory: Negative for cough, chest tightness, shortness of breath and wheezing.   Cardiovascular: Negative for chest pain and palpitations.  Gastrointestinal: Negative for abdominal distention, abdominal pain and constipation.  Endocrine: Negative for polydipsia.  Genitourinary: Negative for dysuria and frequency.  Musculoskeletal:       See hpi  Skin: Negative for rash.  Neurological: Negative for tremors, light-headedness and numbness.  Hematological: Does not bruise/bleed easily.  Psychiatric/Behavioral: Negative for agitation and behavioral problems.    Objective:  BP (!) 191/92   Pulse 70   Temp 97.9 F (36.6 C) (Oral)   Wt 132 lb 6.4 oz (60.1 kg)   SpO2 98%   BMI 25.02 kg/m   BP/Weight 05/08/2017 01/16/2017 01/60/1093  Systolic BP 235 573 220  Diastolic BP 92 254 83  Wt. (Lbs) 132.4 - 118.2  BMI 25.02 - 22.33      Physical Exam  Constitutional: She is oriented to person, place, and time. She appears well-developed and well-nourished.  Cardiovascular: Normal rate, normal heart sounds and intact  distal pulses.   No murmur heard. Pulmonary/Chest: Effort normal and breath sounds normal. She has no wheezes. She has no rales. She exhibits no tenderness.  Abdominal: Soft. Bowel sounds are normal. She exhibits no distension and no mass. There is no tenderness.  Musculoskeletal: She exhibits edema (slight edema of left knee; associated crepitus on flexion and extension). She exhibits no tenderness.  Neurological: She is alert  and oriented to person, place, and time.     Assessment & Plan:   1. Essential hypertension Uncontrolled due to running out of antihypertensive Clonidine 0.1 mg administered and blood pressure repeated after 30 minutes Low-sodium diet - losartan (COZAAR) 100 MG tablet; TAKE 1 TABLET BY MOUTH ONCE DAILY  Dispense: 30 tablet; Refill: 6 - CMP14+EGFR  2. Screening for viral disease - Hepatitis c antibody (reflex) - HIV antibody (with reflex)  3. Primary osteoarthritis of left knee Continue with knee brace Use OTC Aleve as needed   Meds ordered this encounter  Medications  . losartan (COZAAR) 100 MG tablet    Sig: TAKE 1 TABLET BY MOUTH ONCE DAILY    Dispense:  30 tablet    Refill:  6    Please consider 90 day supplies to promote better adherence    Follow-up: Return in about 6 weeks (around 06/19/2017) for Complete physical, Pap smear, colonoscopy and mammogram.   Arnoldo Morale MD

## 2017-05-08 NOTE — Patient Instructions (Signed)
Arthritis Arthritis means joint pain. It can also mean joint disease. A joint is a place where bones come together. People who have arthritis may have:  Red joints.  Swollen joints.  Stiff joints.  Warm joints.  A fever.  A feeling of being sick. Follow these instructions at home: Pay attention to any changes in your symptoms. Take these actions to help with your pain and swelling. Medicines   Take over-the-counter and prescription medicines only as told by your doctor.  Do not take aspirin for pain if your doctor says that you may have gout. Activity   Rest your joint if your doctor tells you to.  Avoid activities that make the pain worse.  Exercise your joint regularly as told by your doctor. Try doing exercises like:  Swimming.  Water aerobics.  Biking.  Walking. Joint Care    If your joint is swollen, keep it raised (elevated) if told by your doctor.  If your joint feels stiff in the morning, try taking a warm shower.  If you have diabetes, do not apply heat without asking your doctor.  If told, apply heat to the joint:  Put a towel between the joint and the hot pack or heating pad.  Leave the heat on the area for 20-30 minutes.  If told, apply ice to the joint:  Put ice in a plastic bag.  Place a towel between your skin and the bag.  Leave the ice on for 20 minutes, 2-3 times per day.  Keep all follow-up visits as told by your doctor. Contact a doctor if:  The pain gets worse.  You have a fever. Get help right away if:  You have very bad pain in your joint.  You have swelling in your joint.  Your joint is red.  Many joints become painful and swollen.  You have very bad back pain.  Your leg is very weak.  You cannot control your pee (urine) or poop (stool). This information is not intended to replace advice given to you by your health care provider. Make sure you discuss any questions you have with your health care  provider. Document Released: 02/20/2010 Document Revised: 05/03/2016 Document Reviewed: 02/21/2015 Elsevier Interactive Patient Education  2017 [employer] Inc[employer]  

## 2017-05-09 LAB — HEPATITIS C ANTIBODY (REFLEX): HCV AB: 0.1 {s_co_ratio} (ref 0.0–0.9)

## 2017-05-09 LAB — CMP14+EGFR
A/G RATIO: 1.2 (ref 1.2–2.2)
ALK PHOS: 96 IU/L (ref 39–117)
ALT: 15 IU/L (ref 0–32)
AST: 16 IU/L (ref 0–40)
Albumin: 3.6 g/dL (ref 3.6–4.8)
BILIRUBIN TOTAL: 0.6 mg/dL (ref 0.0–1.2)
BUN/Creatinine Ratio: 16 (ref 12–28)
BUN: 13 mg/dL (ref 8–27)
CHLORIDE: 103 mmol/L (ref 96–106)
CO2: 26 mmol/L (ref 18–29)
Calcium: 9.4 mg/dL (ref 8.7–10.3)
Creatinine, Ser: 0.83 mg/dL (ref 0.57–1.00)
GFR calc Af Amer: 86 mL/min/{1.73_m2} (ref >59)
GFR calc non Af Amer: 75 mL/min/{1.73_m2} (ref >59)
GLUCOSE: 80 mg/dL (ref 65–99)
Globulin, Total: 3 g/dL (ref 1.5–4.5)
POTASSIUM: 4.2 mmol/L (ref 3.5–5.2)
Sodium: 143 mmol/L (ref 134–144)
Total Protein: 6.6 g/dL (ref 6.0–8.5)

## 2017-05-09 LAB — HCV COMMENT:

## 2017-05-09 LAB — HIV ANTIBODY (ROUTINE TESTING W REFLEX): HIV SCREEN 4TH GENERATION: NONREACTIVE

## 2017-08-21 ENCOUNTER — Ambulatory Visit (INDEPENDENT_AMBULATORY_CARE_PROVIDER_SITE_OTHER): Payer: Self-pay | Admitting: Orthopedic Surgery

## 2017-08-21 ENCOUNTER — Ambulatory Visit (INDEPENDENT_AMBULATORY_CARE_PROVIDER_SITE_OTHER): Payer: Self-pay

## 2017-08-21 ENCOUNTER — Encounter (INDEPENDENT_AMBULATORY_CARE_PROVIDER_SITE_OTHER): Payer: Self-pay | Admitting: Orthopedic Surgery

## 2017-08-21 DIAGNOSIS — M79661 Pain in right lower leg: Secondary | ICD-10-CM

## 2017-08-21 DIAGNOSIS — M25562 Pain in left knee: Secondary | ICD-10-CM

## 2017-08-21 DIAGNOSIS — G8929 Other chronic pain: Secondary | ICD-10-CM

## 2017-08-21 DIAGNOSIS — M25462 Effusion, left knee: Secondary | ICD-10-CM

## 2017-08-24 DIAGNOSIS — M25462 Effusion, left knee: Secondary | ICD-10-CM

## 2017-08-24 MED ORDER — METHYLPREDNISOLONE ACETATE 40 MG/ML IJ SUSP
40.0000 mg | INTRAMUSCULAR | Status: AC | PRN
  Administered 2017-08-24: 40 mg via INTRA_ARTICULAR

## 2017-08-24 MED ORDER — LIDOCAINE HCL 1 % IJ SOLN
5.0000 mL | INTRAMUSCULAR | Status: AC | PRN
  Administered 2017-08-24: 5 mL

## 2017-08-24 MED ORDER — BUPIVACAINE HCL 0.25 % IJ SOLN
4.0000 mL | INTRAMUSCULAR | Status: AC | PRN
  Administered 2017-08-24: 4 mL via INTRA_ARTICULAR

## 2017-08-24 NOTE — Progress Notes (Signed)
Office Visit Note   Patient: Brandy Bond           Date of Birth: 07-31-1951           MRN: [id number] Visit Date: 08/21/2017 Requested by: Jaclyn Shaggy, MD [address] [place], [place] [address] PCP: Jaclyn Shaggy, MD  Subjective: Chief Complaint  Patient presents with  . Right Leg - Pain, Edema  . Left Knee - Edema, Pain    HPI: Brandy Bond is a patient with left knee pain and right calf pain.  She states that her left knee has been swollen and painful for the last 4 months.  Denies a history of injury.  Reports that the whole knee hurts.  She also states that it buckles.  Her walking endurance is greatly diminished and the patient does have night pain.  The patient also describes a two-month history of a knot in her right calf.  She feels like the leg is swollen.  She takes Aleve for her symptoms.  She denies any mechanical symptoms in the knee but does report pain in that right calf with prolonged ambulation              ROS: All systems reviewed are negative as they relate to the chief complaint within the history of present illness.  Patient denies  fevers or chills.   Assessment & Plan: Visit Diagnoses:  1. Chronic pain of left knee   2. Swelling of left knee joint   3. Pain of right calf     Plan: Impression is right calf mass.  Looked at it with ultrasound.  Does not appear to be any type of cystic continuation of a Baker cyst.  It does appear to be well encapsulated mass below the fascial plane.  The MRI with contrast to evaluate this mass.  In regards to the left knee patient has end-stage tricompartmental arthritis and valgus alignment.  Plan is for injection into the left knee today.  Aspiration also performed.  She is going to need knee replacement sometime in the future and she can decide when.  I'll see her back after the MRI scan on right calf Follow-Up Instructions: Return for after MRI.   Orders:  Orders Placed This Encounter  Procedures  . XR KNEE 3 VIEW  LEFT  . XR Tibia/Fibula Right  . MR TIBIA FIBULA RIGHT W CONTRAST   No orders of the defined types were placed in this encounter.     Procedures: Large Joint Inj Date/Time: 08/24/2017 1:01 PM Performed by: Cammy Copa Authorized by: Cammy Copa   Consent Given by:  Patient Site marked: the procedure site was marked   Timeout: prior to procedure the correct patient, procedure, and site was verified   Indications:  Pain, joint swelling and diagnostic evaluation Location:  Knee Site:  L knee Prep: patient was prepped and draped in usual sterile fashion   Needle Size:  18 G Needle Length:  1.5 inches Approach:  Superolateral Ultrasound Guidance: No   Fluoroscopic Guidance: No   Arthrogram: No   Medications:  5 mL lidocaine 1 %; 4 mL bupivacaine 0.25 %; 40 mg methylPREDNISolone acetate 40 MG/ML Aspiration Attempted: Yes   Aspirate amount (mL):  20 Patient tolerance:  Patient tolerated the procedure well with no immediate complications     Clinical Data: No additional findings.  Objective: Vital Signs: There were no vitals taken for this visit.  Physical Exam:   Constitutional: Patient appears well-developed HEENT:  Head: Normocephalic Eyes:EOM are normal Neck: Normal range of motion Cardiovascular: Normal rate Pulmonary/chest: Effort normal Neurologic: Patient is alert Skin: Skin is warm Psychiatric: Patient has normal mood and affect    Ortho Exam: Orthopedic exam demonstrates Brandy Bond alignment left knee with 5 flexion contracture intact extensor mechanism palpable pedal pulses no groin pain with internal/external rotation of the leg diffuse joint line tenderness no other masses lymph adenopathy or skin changes noted in the left knee region.  Right calf is examined.  She does have a mass cystic structure in this area.  Freely mobile but below the fascia.  She does not have continuation of a Baker cyst into this area.  This is confirmed under  ultrasound.  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  This appears to be a discrete cystic lesion potentially with solid components in the calf.  Specialty Comments:  No specialty comments available.  Imaging: No results found.   PMFS History: Patient Active Problem List   Diagnosis Date Noted  . Osteoarthritis of left knee 10/11/2016  . Hypertension 09/18/2016  . CONSTIPATION, CHRONIC 10/11/2010   Past Medical History:  Diagnosis Date  . Hypertension     No family history on file.  No past surgical history on file. Social History   Occupational History  . Not on file.   Social History Main Topics  . Smoking status: Never Smoker  . Smokeless tobacco: Never Used  . Alcohol use 1.2 - 1.8 oz/week    2 - 3 Cans of beer per week     Comment: occasionally  . Drug use: No  . Sexual activity: Not Currently

## 2017-09-15 ENCOUNTER — Ambulatory Visit
Admission: RE | Admit: 2017-09-15 | Discharge: 2017-09-15 | Disposition: A | Discharge status: Home / Self Care | Payer: No Typology Code available for payment source | Source: Ambulatory Visit | Attending: Orthopedic Surgery | Admitting: Orthopedic Surgery

## 2017-09-15 DIAGNOSIS — M79661 Pain in right lower leg: Secondary | ICD-10-CM

## 2017-09-15 MED ORDER — GADOBENATE DIMEGLUMINE 529 MG/ML IV SOLN
10.0000 mL | Freq: Once | INTRAVENOUS | Status: AC | PRN
  Administered 2017-09-15: 10 mL via INTRAVENOUS

## 2017-09-18 ENCOUNTER — Ambulatory Visit (INDEPENDENT_AMBULATORY_CARE_PROVIDER_SITE_OTHER): Payer: Self-pay | Admitting: Orthopedic Surgery

## 2017-09-18 ENCOUNTER — Encounter (INDEPENDENT_AMBULATORY_CARE_PROVIDER_SITE_OTHER): Payer: Self-pay | Admitting: Orthopedic Surgery

## 2017-09-18 DIAGNOSIS — M1712 Unilateral primary osteoarthritis, left knee: Secondary | ICD-10-CM

## 2017-09-18 DIAGNOSIS — M1711 Unilateral primary osteoarthritis, right knee: Secondary | ICD-10-CM

## 2017-09-23 NOTE — Progress Notes (Signed)
   Office Visit Note   Patient: Brandy Bond           Date of Birth: 12-Feb-1951           MRN: [id number] Visit Date: 09/18/2017 Requested by: Jaclyn Shaggy, MD [address] [place], [place] [address] PCP: Jaclyn Shaggy, MD  Subjective: Chief Complaint  Patient presents with  . Right Knee - Follow-up    HPI: patient is a 66 year old here to follow-up right knee MRI scan.  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  The shot did help her left knee which has end-stage valgus are sure arthritis.  Her right knee is symptomatic as well and she feels pressure posteriorly.              ROS: All systems reviewed are negative as they relate to the chief complaint within the history of present illness.  Patient denies  fevers or chills.   Assessment & Plan: Visit Diagnoses:  1. Unilateral primary osteoarthritis, right knee   2. Unilateral primary osteoarthritis, left knee     Plan: impression is bilateral knee arthritis worse on the left-hand side.  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  Plan is ultrasound guided aspiration of that Baker cyst which is performed today.  Did this prone.  Retrieve to about 30 mL of clear yellow fluid.  We can potentially do another injection in that left knee sometime in the near future but at Su to send for that now.  She may need knee replacement in the left knee when she wants more pain relief  Follow-Up Instructions: Return if symptoms worsen or fail to improve.   Orders:  No orders of the defined types were placed in this encounter.  No orders of the defined types were placed in this encounter.     Procedures: No procedures performed   Clinical Data: No additional findings.  Objective: Vital Signs: There were no vitals taken for this visit.  Physical Exam:   Constitutional: Patient appears well-developed HEENT:  Head: Normocephalic Eyes:EOM are normal Neck:  Normal range of motion Cardiovascular: Normal rate Pulmonary/chest: Effort normal Neurologic: Patient is alert Skin: Skin is warm Psychiatric: Patient has normal mood and affect    Ortho Exam: orthopedic exam demonstrates valgus alignment left lower chrie with palpable pedal pulses bilaterally.  Right knee has trace effusion but large palpable Baker cyst posteriorly.  Ankle dorsiflexion plantar flexion is intact.  Specialty Comments:  No specialty comments available.  Imaging: No results found.   PMFS History: Patient Active Problem List   Diagnosis Date Noted  . Osteoarthritis of left knee 10/11/2016  . Hypertension 09/18/2016  . CONSTIPATION, CHRONIC 10/11/2010   Past Medical History:  Diagnosis Date  . Hypertension     No family history on file.  No past surgical history on file. Social History   Occupational History  . Not on file.   Social History Main Topics  . Smoking status: Never Smoker  . Smokeless tobacco: Never Used  . Alcohol use 1.2 - 1.8 oz/week    2 - 3 Cans of beer per week     Comment: occasionally  . Drug use: No  . Sexual activity: Not Currently

## 2017-10-17 ENCOUNTER — Encounter (INDEPENDENT_AMBULATORY_CARE_PROVIDER_SITE_OTHER): Payer: Self-pay | Admitting: Orthopedic Surgery

## 2017-10-17 ENCOUNTER — Ambulatory Visit (INDEPENDENT_AMBULATORY_CARE_PROVIDER_SITE_OTHER): Payer: Self-pay | Admitting: Orthopedic Surgery

## 2017-10-17 DIAGNOSIS — M1712 Unilateral primary osteoarthritis, left knee: Secondary | ICD-10-CM

## 2017-10-17 DIAGNOSIS — M1711 Unilateral primary osteoarthritis, right knee: Secondary | ICD-10-CM

## 2017-10-20 DIAGNOSIS — M1712 Unilateral primary osteoarthritis, left knee: Secondary | ICD-10-CM

## 2017-10-20 MED ORDER — LIDOCAINE HCL 1 % IJ SOLN
5.0000 mL | INTRAMUSCULAR | Status: AC | PRN
  Administered 2017-10-20: 5 mL

## 2017-10-20 MED ORDER — METHYLPREDNISOLONE ACETATE 40 MG/ML IJ SUSP
40.0000 mg | INTRAMUSCULAR | Status: AC | PRN
  Administered 2017-10-20: 40 mg via INTRA_ARTICULAR

## 2017-10-20 MED ORDER — BUPIVACAINE HCL 0.25 % IJ SOLN
4.0000 mL | INTRAMUSCULAR | Status: AC | PRN
  Administered 2017-10-20: 4 mL via INTRA_ARTICULAR

## 2017-10-20 NOTE — Progress Notes (Signed)
Office Visit Note   Patient: Brandy Bond           Date of Birth: 02/16/1951           MRN: [id number] Visit Date: 10/17/2017 Requested by: Jaclyn ShaggyAmao, Enobong, MD [address] [place], [place] [id number] PCP: Jaclyn ShaggyAmao, Enobong, MD  Subjective: Chief Complaint  Patient presents with  . Knee Pain    HPI: Brandy ReachCecelia is a 66 year old patient with bilateral knee pain left worse than right.  She had Baker's cyst aspiration on 09/18/2017 on the right knee which did help some.  She still reports severe left knee pain she has left knee valgus arthritis which is end-stage.  Reports constant pain and swelling in that left knee.              ROS:All systems reviewed are negative as they relate to the chief complaint within the history of present illness.  Patient denies  fevers or chills.   Assessment & Plan: Visit Diagnoses:  1. Unilateral primary osteoarthritis, left knee   2. Unilateral primary osteoarthritis, right knee     Plan: Impression is bilateral knee pain left worse than right.  Plan is to aspirate and inject that left knee today.  3 month return for further treatment.  I think her only really definitive option for treating this will be knee replacement.  I'll see her in 3 months  Follow-Up Instructions: Return in about 3 years (around 10/17/2020).   Orders:  No orders of the defined types were placed in this encounter.  No orders of the defined types were placed in this encounter.     Procedures: Large Joint Inj: L knee on 10/20/2017 8:52 PM Indications: diagnostic evaluation, joint swelling and pain Details: 18 G 1.5 in needle, superolateral approach  Arthrogram: No  Medications: 5 mL lidocaine 1 %; 40 mg methylPREDNISolone acetate 40 MG/ML; 4 mL bupivacaine 0.25 % Aspirate: 40 mL yellow Outcome: tolerated well, no immediate complications Procedure, treatment alternatives, risks and benefits explained, specific risks discussed. Consent was given by the patient. Immediately  prior to procedure a time out was called to verify the correct patient, procedure, equipment, support staff and site/side marked as required. Patient was prepped and draped in the usual sterile fashion.       Clinical Data: No additional findings.  Objective: Vital Signs: There were no vitals taken for this visit.  Physical Exam:   Constitutional: Patient appears well-developed HEENT:  Head: Normocephalic Eyes:EOM are normal Neck: Normal range of motion Cardiovascular: Normal rate Pulmonary/chest: Effort normal Neurologic: Patient is alert Skin: Skin is warm Psychiatric: Patient has normal mood and affect    Ortho Exam: Orthopedic exam demonstrates valgus alignment left lower chrie bilateral palpable pedal pulses mild effusion right knee moderate effusion left knee collateral and cruciates are stable.  No groin pain with internal/external rotation of the leg.  No other masses lymph adenopathy or skin changes noted in the knee regions.  Patient does have a Baker cyst on the right which has only mildly reformed without significant tension  Specialty Comments:  No specialty comments available.  Imaging: No results found.   PMFS History: Patient Active Problem List   Diagnosis Date Noted  . Osteoarthritis of left knee 10/11/2016  . Hypertension 09/18/2016  . CONSTIPATION, CHRONIC 10/11/2010   Past Medical History:  Diagnosis Date  . Hypertension     History reviewed. No pertinent family history.  History reviewed. No pertinent surgical history. Social History   Occupational History  .  Not on file  Tobacco Use  . Smoking status: Never Smoker  . Smokeless tobacco: Never Used  Substance and Sexual Activity  . Alcohol use: Yes    Alcohol/week: 1.2 - 1.8 oz    Types: 2 - 3 Cans of beer per week    Comment: occasionally  . Drug use: No  . Sexual activity: Not Currently

## 2018-01-16 ENCOUNTER — Encounter (INDEPENDENT_AMBULATORY_CARE_PROVIDER_SITE_OTHER): Payer: Self-pay | Admitting: Orthopedic Surgery

## 2018-01-16 ENCOUNTER — Ambulatory Visit (INDEPENDENT_AMBULATORY_CARE_PROVIDER_SITE_OTHER): Payer: Self-pay | Admitting: Orthopedic Surgery

## 2018-01-16 DIAGNOSIS — M1712 Unilateral primary osteoarthritis, left knee: Secondary | ICD-10-CM

## 2018-01-17 ENCOUNTER — Ambulatory Visit (INDEPENDENT_AMBULATORY_CARE_PROVIDER_SITE_OTHER): Payer: Self-pay | Admitting: Orthopedic Surgery

## 2018-01-18 ENCOUNTER — Encounter (INDEPENDENT_AMBULATORY_CARE_PROVIDER_SITE_OTHER): Payer: Self-pay | Admitting: Orthopedic Surgery

## 2018-01-18 DIAGNOSIS — M1712 Unilateral primary osteoarthritis, left knee: Secondary | ICD-10-CM

## 2018-01-18 MED ORDER — METHYLPREDNISOLONE ACETATE 40 MG/ML IJ SUSP
40.0000 mg | INTRAMUSCULAR | Status: AC | PRN
  Administered 2018-01-18: 40 mg via INTRA_ARTICULAR

## 2018-01-18 MED ORDER — LIDOCAINE HCL 1 % IJ SOLN
5.0000 mL | INTRAMUSCULAR | Status: AC | PRN
  Administered 2018-01-18: 5 mL

## 2018-01-18 MED ORDER — BUPIVACAINE HCL 0.25 % IJ SOLN
4.0000 mL | INTRAMUSCULAR | Status: AC | PRN
  Administered 2018-01-18: 4 mL via INTRA_ARTICULAR

## 2018-01-18 NOTE — Progress Notes (Signed)
   Office Visit Note   Patient: Brandy Bond           Date of Birth: 03-08-1951           MRN: [id number] Visit Date: 01/16/2018 Requested by: Hoy Register, MD [address] [place], [place] [address] PCP: Hoy Register, MD  Subjective: Chief Complaint  Patient presents with  . Left Knee - Follow-up    HPI: Misk is a patient with known left knee arthritis.  She had an injection in November which helped.  She would like repeat aspiration and injection today.  Is not really ready to consider replacement.              ROS: All systems reviewed are negative as they relate to the chief complaint within the history of present illness.  Patient denies  fevers or chills.   Assessment & Plan: Visit Diagnoses:  1. Unilateral primary osteoarthritis, left knee     Plan: Impression is severe left knee arthritis.  Aspiration injection performed today.  Valgus alignment is worsening.  The longer she waits the more difficult her knee replacement will become.  Follow-Up Instructions: Return if symptoms worsen or fail to improve.   Orders:  No orders of the defined types were placed in this encounter.  No orders of the defined types were placed in this encounter.     Procedures: Large Joint Inj: L knee on 01/18/2018 8:34 AM Indications: diagnostic evaluation, joint swelling and pain Details: 18 G 1.5 in needle, superolateral approach  Arthrogram: No  Medications: 5 mL lidocaine 1 %; 40 mg methylPREDNISolone acetate 40 MG/ML; 4 mL bupivacaine 0.25 % Aspirate: 50 mL yellow Outcome: tolerated well, no immediate complications Procedure, treatment alternatives, risks and benefits explained, specific risks discussed. Consent was given by the patient. Immediately prior to procedure a time out was called to verify the correct patient, procedure, equipment, support staff and site/side marked as required. Patient was prepped and draped in the usual sterile fashion.       Clinical  Data: No additional findings.  Objective: Vital Signs: There were no vitals taken for this visit.  Physical Exam:   Constitutional: Patient appears well-developed HEENT:  Head: Normocephalic Eyes:EOM are normal Neck: Normal range of motion Cardiovascular: Normal rate Pulmonary/chest: Effort normal Neurologic: Patient is alert Skin: Skin is warm Psychiatric: Patient has normal mood and affect    Ortho Exam: Pedis exam demonstrates valgus alignment left lower extremity.  Functional and intact mild effusion is present.  Extensor mechanism is intact.  Gait antalgic.  Specialty Comments:  No specialty comments available.  Imaging: No results found.   PMFS History: Patient Active Problem List   Diagnosis Date Noted  . Osteoarthritis of left knee 10/11/2016  . Hypertension 09/18/2016  . CONSTIPATION, CHRONIC 10/11/2010   Past Medical History:  Diagnosis Date  . Hypertension     History reviewed. No pertinent family history.  History reviewed. No pertinent surgical history. Social History   Occupational History  . Not on file  Tobacco Use  . Smoking status: Never Smoker  . Smokeless tobacco: Never Used  Substance and Sexual Activity  . Alcohol use: Yes    Alcohol/week: 1.2 - 1.8 oz    Types: 2 - 3 Cans of beer per week    Comment: occasionally  . Drug use: No  . Sexual activity: Not Currently

## 2018-01-20 ENCOUNTER — Ambulatory Visit: Payer: Self-pay | Admitting: Family Medicine

## 2018-01-31 ENCOUNTER — Ambulatory Visit: Payer: Self-pay | Attending: Family Medicine | Admitting: Family Medicine

## 2018-01-31 ENCOUNTER — Encounter: Payer: Self-pay | Admitting: Family Medicine

## 2018-01-31 VITALS — BP 167/92 | HR 69 | Temp 98.0°F | Wt 120.6 lb

## 2018-01-31 DIAGNOSIS — R0982 Postnasal drip: Secondary | ICD-10-CM

## 2018-01-31 DIAGNOSIS — Z7982 Long term (current) use of aspirin: Secondary | ICD-10-CM | POA: Insufficient documentation

## 2018-01-31 DIAGNOSIS — M1712 Unilateral primary osteoarthritis, left knee: Secondary | ICD-10-CM

## 2018-01-31 DIAGNOSIS — Z23 Encounter for immunization: Secondary | ICD-10-CM

## 2018-01-31 DIAGNOSIS — I10 Essential (primary) hypertension: Secondary | ICD-10-CM

## 2018-01-31 DIAGNOSIS — Z880 Allergy status to penicillin: Secondary | ICD-10-CM | POA: Insufficient documentation

## 2018-01-31 MED ORDER — LOSARTAN POTASSIUM 100 MG PO TABS: ORAL_TABLET | ORAL | 1 refills | Status: DC

## 2018-01-31 MED ORDER — CLONIDINE HCL 0.1 MG PO TABS
0.2000 mg | ORAL_TABLET | Freq: Once | ORAL | Status: AC
  Administered 2018-01-31: 0.2 mg via ORAL

## 2018-01-31 MED ORDER — CETIRIZINE HCL 10 MG PO TABS: 10.0000 mg | ORAL_TABLET | Freq: Every day | ORAL | 1 refills | Status: DC

## 2018-01-31 NOTE — Patient Instructions (Signed)

## 2018-01-31 NOTE — Progress Notes (Signed)
Subjective:  Patient ID: Brandy Bond, female    DOB: 05-Jul-1951  Age: 67 y.o. MRN: [id number]  CC: Hypertension   HPI [name] is a 67 year old female with a history of hypertension, osteoarthritis of the left knee who presents today for follow-up visit.  She was last seen in the office 9 months ago and has been out of her antihypertensives for the last 3 months hence a severely elevated blood pressure of 200/112. Clonidine 0.2 mg administered and repeat blood pressure after 80 minutes was 167/92.  She underwent a left knee aspiration and injection on 01/16/18 by Dr. Marlou Sa of [facility] orthopedics.  Total knee replacement recommended but as per the patient and her cousin who accompanies her for today's visit,  the cost of surgery is a major issue as she has a visit and has no [employer] financial discount. She reports improvement in knee symptoms after her aspiration.  Prior to that visit she was seen 3 months ago at which time she also underwent aspiration and injection of the left knee.  Complains of postnasal drip and having to clear her throat constantly.  Endorses intermittent cough which is sometimes productive of whitish sputum but no shortness of breath, chest pain, sinus congestion.  Past Medical History:  Diagnosis Date  . Hypertension     History reviewed. No pertinent surgical history.  Allergies  Allergen Reactions  . Penicillins Shortness Of Breath, Itching and Swelling    Has patient had a PCN reaction causing immediate rash, facial/tongue/throat swelling, SOB or lightheadedness with hypotension: yes Has patient had a PCN reaction causing severe rash involving mucus membranes or skin necrosis: no Has patient had a PCN reaction that required hospitalization: unknown Has patient had a PCN reaction occurring within the last 10 years: no If all of the above answers are "NO", then may proceed with Cephalosporin use.      Outpatient Medications Prior to Visit  Medication Sig  Dispense Refill  . Aspirin-Caffeine (BAYER BACK & BODY PAIN EX ST) 500-32.5 MG TABS Take 1 tablet by mouth as needed (for pain).    . Iron-Vitamins (GERITOL COMPLETE PO) Take 1 tablet by mouth daily.      . naproxen (NAPROSYN) 500 MG tablet Take 1 tablet (500 mg total) by mouth 2 (two) times daily with a meal. (Patient not taking: Reported on 01/31/2018) 30 tablet 1  . losartan (COZAAR) 100 MG tablet TAKE 1 TABLET BY MOUTH ONCE DAILY (Patient not taking: Reported on 01/31/2018) 30 tablet 6   No facility-administered medications prior to visit.     ROS Review of Systems  Constitutional: Negative for activity change, appetite change and fatigue.  HENT: Positive for postnasal drip. Negative for congestion, sinus pressure, sinus pain and sore throat.   Eyes: Negative for visual disturbance.  Respiratory: Positive for cough. Negative for chest tightness, shortness of breath and wheezing.   Cardiovascular: Negative for chest pain and palpitations.  Gastrointestinal: Negative for abdominal distention, abdominal pain and constipation.  Endocrine: Negative for polydipsia.  Genitourinary: Negative for dysuria and frequency.  Musculoskeletal:       See hpi  Skin: Negative for rash.  Neurological: Negative for tremors, light-headedness and numbness.  Hematological: Does not bruise/bleed easily.  Psychiatric/Behavioral: Negative for agitation and behavioral problems.    Objective:  BP (!) 200/112   Pulse 89   Temp 98 F (36.7 C) (Oral)   Wt 120 lb 9.6 oz (54.7 kg)   SpO2 99%   BMI 22.79 kg/m  BP/Weight 01/31/2018 03/19/3012 12/13/3886  Systolic BP 757 972 820  Diastolic BP 601 92 561  Wt. (Lbs) 120.6 132.4 -  BMI 22.79 25.02 -      Physical Exam  Constitutional: She is oriented to person, place, and time. She appears well-developed and well-nourished.  Cardiovascular: Normal rate, normal heart sounds and intact distal pulses.  No murmur heard. Pulmonary/Chest: Effort normal and breath  sounds normal. She has no wheezes. She has no rales. She exhibits no tenderness.  Abdominal: Soft. Bowel sounds are normal. She exhibits no distension and no mass. There is no tenderness.  Musculoskeletal: Normal range of motion.  Left knee edema and tenderness on the medial aspect with varus deformity  Neurological: She is alert and oriented to person, place, and time.  Skin: Skin is warm and dry.  Psychiatric: She has a normal mood and affect.     Assessment & Plan:   1. Essential hypertension Uncontrolled due to running out of medications Clonidine 0.2 mg administered in the clinic and blood pressure repeated Refilled medications Counseled on blood pressure goal of less than 130/80, low-sodium, DASH diet, medication compliance, 150 minutes of moderate intensity exercise per week. Discussed medication compliance, adverse effects. - cloNIDine (CATAPRES) tablet 0.2 mg - losartan (COZAAR) 100 MG tablet; TAKE 1 TABLET BY MOUTH ONCE DAILY  Dispense: 90 tablet; Refill: 1 - CMP14+EGFR; Future - Lipid panel; Future  2. Primary osteoarthritis of left knee Status post left knee aspiration Total knee replacement recommended - patient unable to undergo this surgery due to cost as she has no medical coverage and is not eligible to apply for the [employer] financial discount given she has no papers.  3. Post-nasal drainage - cetirizine (ZYRTEC) 10 MG tablet; Take 1 tablet (10 mg total) by mouth daily.  Dispense: 30 tablet; Refill: 1   Meds ordered this encounter  Medications  . cloNIDine (CATAPRES) tablet 0.2 mg  . losartan (COZAAR) 100 MG tablet    Sig: TAKE 1 TABLET BY MOUTH ONCE DAILY    Dispense:  90 tablet    Refill:  1    Please consider 90 day supplies to promote better adherence  . cetirizine (ZYRTEC) 10 MG tablet    Sig: Take 1 tablet (10 mg total) by mouth daily.    Dispense:  30 tablet    Refill:  1    Follow-up: Return in about 3 months (around 04/30/2018) for follow up of  Hypertension.   Charlott Rakes MD

## 2018-02-04 ENCOUNTER — Ambulatory Visit: Payer: Self-pay | Attending: Family Medicine

## 2018-02-04 DIAGNOSIS — I10 Essential (primary) hypertension: Secondary | ICD-10-CM | POA: Insufficient documentation

## 2018-02-04 NOTE — Progress Notes (Signed)
Patient here for lab visit only 

## 2018-02-05 LAB — CMP14+EGFR
ALBUMIN: 3.7 g/dL (ref 3.6–4.8)
ALT: 26 IU/L (ref 0–32)
AST: 21 IU/L (ref 0–40)
Albumin/Globulin Ratio: 1.5 (ref 1.2–2.2)
Alkaline Phosphatase: 170 IU/L — ABNORMAL HIGH (ref 39–117)
BUN / CREAT RATIO: 12 (ref 12–28)
BUN: 12 mg/dL (ref 8–27)
Bilirubin Total: 0.5 mg/dL (ref 0.0–1.2)
CO2: 29 mmol/L (ref 20–29)
CREATININE: 0.98 mg/dL (ref 0.57–1.00)
Calcium: 9 mg/dL (ref 8.7–10.3)
Chloride: 102 mmol/L (ref 96–106)
GFR calc Af Amer: 70 mL/min/{1.73_m2} (ref >59)
GFR calc non Af Amer: 61 mL/min/{1.73_m2} (ref >59)
GLUCOSE: 82 mg/dL (ref 65–99)
Globulin, Total: 2.4 g/dL (ref 1.5–4.5)
Potassium: 3.3 mmol/L — ABNORMAL LOW (ref 3.5–5.2)
Sodium: 145 mmol/L — ABNORMAL HIGH (ref 134–144)
TOTAL PROTEIN: 6.1 g/dL (ref 6.0–8.5)

## 2018-02-05 LAB — LIPID PANEL
Chol/HDL Ratio: 3 ratio (ref 0.0–4.4)
Cholesterol, Total: 189 mg/dL (ref 100–199)
HDL: 63 mg/dL (ref >39)
LDL Calculated: 112 mg/dL — ABNORMAL HIGH (ref 0–99)
Triglycerides: 69 mg/dL (ref 0–149)
VLDL Cholesterol Cal: 14 mg/dL (ref 5–40)

## 2018-02-06 ENCOUNTER — Telehealth: Payer: Self-pay

## 2018-02-06 NOTE — Telephone Encounter (Signed)
Patient was called and informed of lab results. 

## 2018-04-16 ENCOUNTER — Ambulatory Visit (INDEPENDENT_AMBULATORY_CARE_PROVIDER_SITE_OTHER): Payer: Self-pay | Admitting: Orthopedic Surgery

## 2018-04-18 ENCOUNTER — Encounter (INDEPENDENT_AMBULATORY_CARE_PROVIDER_SITE_OTHER): Payer: Self-pay | Admitting: Orthopedic Surgery

## 2018-04-18 ENCOUNTER — Ambulatory Visit (INDEPENDENT_AMBULATORY_CARE_PROVIDER_SITE_OTHER): Payer: Self-pay | Admitting: Orthopedic Surgery

## 2018-04-18 DIAGNOSIS — M1712 Unilateral primary osteoarthritis, left knee: Secondary | ICD-10-CM

## 2018-04-18 MED ORDER — LIDOCAINE HCL 1 % IJ SOLN
5.0000 mL | INTRAMUSCULAR | Status: AC | PRN
Start: 2018-04-18 — End: 2018-04-18
  Administered 2018-04-18: 5 mL

## 2018-04-18 MED ORDER — METHYLPREDNISOLONE ACETATE 40 MG/ML IJ SUSP
40.0000 mg | INTRAMUSCULAR | Status: AC | PRN
  Administered 2018-04-18: 40 mg via INTRA_ARTICULAR

## 2018-04-18 MED ORDER — BUPIVACAINE HCL 0.25 % IJ SOLN
4.0000 mL | INTRAMUSCULAR | Status: AC | PRN
  Administered 2018-04-18: 4 mL via INTRA_ARTICULAR

## 2018-04-18 NOTE — Progress Notes (Signed)
Office Visit Note   Patient: Brandy Bond           Date of Birth: 1950/12/27           MRN: [id number] Visit Date: 04/18/2018 Requested by: Hoy Register, MD [id number] [place], [place] [address] PCP: Hoy Register, MD  Subjective: Chief Complaint  Patient presents with  . Left Knee - Pain  . Right Knee - Pain    HPI: Brandy Bond is a patient with left knee pain.  Last knee injection 3 months ago.  She has valgus arthritis in that left knee and difficulty ambulating.  She cannot get a knee replacement because of social situation.  She does do pretty well with these knee injections.              ROS: All systems reviewed are negative as they relate to the chief complaint within the history of present illness.  Patient denies  fevers or chills.   Assessment & Plan: Visit Diagnoses:  1. Unilateral primary osteoarthritis, left knee     Plan: Impression is end-stage arthritis left knee and the patient who is not really able to get knee replacement.  Plan is aspiration and injection today.  I will see her back as needed. Follow-Up Instructions: Return if symptoms worsen or fail to improve.   Orders:  No orders of the defined types were placed in this encounter.  No orders of the defined types were placed in this encounter.     Procedures: Large Joint Inj: L knee on 04/18/2018 9:24 AM Indications: diagnostic evaluation, joint swelling and pain Details: 18 G 1.5 in needle, superolateral approach  Arthrogram: No  Medications: 5 mL lidocaine 1 %; 40 mg methylPREDNISolone acetate 40 MG/ML; 4 mL bupivacaine 0.25 % Outcome: tolerated well, no immediate complications Procedure, treatment alternatives, risks and benefits explained, specific risks discussed. Consent was given by the patient. Immediately prior to procedure a time out was called to verify the correct patient, procedure, equipment, support staff and site/side marked as required. Patient was prepped and draped  in the usual sterile fashion.       Clinical Data: No additional findings.  Objective: Vital Signs: There were no vitals taken for this visit.  Physical Exam:   Constitutional: Patient appears well-developed HEENT:  Head: Normocephalic Eyes:EOM are normal Neck: Normal range of motion Cardiovascular: Normal rate Pulmonary/chest: Effort normal Neurologic: Patient is alert Skin: Skin is warm Psychiatric: Patient has normal mood and affect    Ortho Exam: Orthopedic exam demonstrates valgus alignment left lower extremity with palpable pedal pulses range of motion is about 5 degrees from full extension to 95 of flexion on the left-hand side.  No groin pain with internal/external rotation of the leg.  Patient has global knee tenderness to palpation  Specialty Comments:  No specialty comments available.  Imaging: No results found.   PMFS History: Patient Active Problem List   Diagnosis Date Noted  . Osteoarthritis of left knee 10/11/2016  . Hypertension 09/18/2016  . CONSTIPATION, CHRONIC 10/11/2010   Past Medical History:  Diagnosis Date  . Hypertension     History reviewed. No pertinent family history.  History reviewed. No pertinent surgical history. Social History   Occupational History  . Not on file  Tobacco Use  . Smoking status: Never Smoker  . Smokeless tobacco: Never Used  Substance and Sexual Activity  . Alcohol use: Yes    Alcohol/week: 1.2 - 1.8 oz    Types: 2 - 3  Cans of beer per week    Comment: occasionally  . Drug use: No  . Sexual activity: Not Currently

## 2018-05-02 ENCOUNTER — Ambulatory Visit: Payer: Self-pay | Admitting: Family Medicine

## 2018-06-23 ENCOUNTER — Ambulatory Visit: Payer: Self-pay | Attending: Family Medicine | Admitting: Family Medicine

## 2018-06-23 ENCOUNTER — Encounter: Payer: Self-pay | Admitting: Family Medicine

## 2018-06-23 VITALS — BP 188/100 | HR 98 | Temp 98.0°F | Resp 18 | Ht 60.0 in | Wt 120.0 lb

## 2018-06-23 DIAGNOSIS — R05 Cough: Secondary | ICD-10-CM | POA: Insufficient documentation

## 2018-06-23 DIAGNOSIS — M1712 Unilateral primary osteoarthritis, left knee: Secondary | ICD-10-CM | POA: Insufficient documentation

## 2018-06-23 DIAGNOSIS — Z7982 Long term (current) use of aspirin: Secondary | ICD-10-CM | POA: Insufficient documentation

## 2018-06-23 DIAGNOSIS — I10 Essential (primary) hypertension: Secondary | ICD-10-CM | POA: Insufficient documentation

## 2018-06-23 DIAGNOSIS — E876 Hypokalemia: Secondary | ICD-10-CM | POA: Insufficient documentation

## 2018-06-23 DIAGNOSIS — Z79899 Other long term (current) drug therapy: Secondary | ICD-10-CM | POA: Insufficient documentation

## 2018-06-23 DIAGNOSIS — M25562 Pain in left knee: Secondary | ICD-10-CM | POA: Insufficient documentation

## 2018-06-23 DIAGNOSIS — R600 Localized edema: Secondary | ICD-10-CM | POA: Insufficient documentation

## 2018-06-23 DIAGNOSIS — Z9114 Patient's other noncompliance with medication regimen: Secondary | ICD-10-CM | POA: Insufficient documentation

## 2018-06-23 DIAGNOSIS — R059 Cough, unspecified: Secondary | ICD-10-CM

## 2018-06-23 MED ORDER — POTASSIUM CHLORIDE ER 20 MEQ PO TBCR: 20.0000 meq | EXTENDED_RELEASE_TABLET | Freq: Every day | ORAL | 1 refills | Status: DC

## 2018-06-23 MED ORDER — CLONIDINE HCL 0.2 MG PO TABS
0.2000 mg | ORAL_TABLET | Freq: Once | ORAL | Status: AC
  Administered 2018-06-23: 0.2 mg via ORAL

## 2018-06-23 MED ORDER — FUROSEMIDE 20 MG PO TABS: 20.0000 mg | ORAL_TABLET | Freq: Every day | ORAL | 1 refills | Status: DC

## 2018-06-23 MED ORDER — TRAMADOL HCL 50 MG PO TABS: 50.0000 mg | ORAL_TABLET | Freq: Two times a day (BID) | ORAL | 1 refills | Status: DC | PRN

## 2018-06-23 MED ORDER — CARVEDILOL 12.5 MG PO TABS: 12.5000 mg | ORAL_TABLET | Freq: Two times a day (BID) | ORAL | 1 refills | Status: DC

## 2018-06-23 NOTE — Progress Notes (Signed)
Subjective:  Patient ID: Brandy Bond, female    DOB: 06-11-1951  Age: 67 y.o. MRN: [id number]  CC: Follow-up   HPI Brandy Bond is a 67 year old female with a history of hypertension, osteoarthritis of the left knee who presents today for follow-up visit accompanied by her sister. She has been out of her antihypertensive for the last 1 month and her blood pressure is significantly elevated; clonidine 0.2 mg administered in the clinic. She complains of severe left knee pain rated up to 10/10 and has received cortisone injections in the past with improvement only for pain to return after a few months.  She currently does not have any analgesic for her pain. Her sister complains of Losartan being too expensive and would like to have a cheaper medication prescribed for hypertension.  She was previously on lisinopril which was discontinued due to cough but she informs me she still has the cough even though she has been using Zyrtec.  Of note she has bilateral pedal edema which she never noticed but she denies overly being short of breath and attributes her reduced exercise tolerance to her left knee pain.  Past Medical History:  Diagnosis Date  . Hypertension     History reviewed. No pertinent surgical history.  Allergies  Allergen Reactions  . Penicillins Shortness Of Breath, Itching and Swelling    Has patient had a PCN reaction causing immediate rash, facial/tongue/throat swelling, SOB or lightheadedness with hypotension: yes Has patient had a PCN reaction causing severe rash involving mucus membranes or skin necrosis: no Has patient had a PCN reaction that required hospitalization: unknown Has patient had a PCN reaction occurring within the last 10 years: no If all of the above answers are "NO", then may proceed with Cephalosporin use.      Outpatient Medications Prior to Visit  Medication Sig Dispense Refill  . Aspirin-Caffeine (BAYER BACK & BODY PAIN EX ST) 500-32.5 MG TABS Take 1  tablet by mouth as needed (for pain).    . cetirizine (ZYRTEC) 10 MG tablet Take 1 tablet (10 mg total) by mouth daily. 30 tablet 1  . Iron-Vitamins (GERITOL COMPLETE PO) Take 1 tablet by mouth daily.      Marland Kitchen losartan (COZAAR) 100 MG tablet TAKE 1 TABLET BY MOUTH ONCE DAILY 90 tablet 1  . naproxen (NAPROSYN) 500 MG tablet Take 1 tablet (500 mg total) by mouth 2 (two) times daily with a meal. (Patient not taking: Reported on 01/31/2018) 30 tablet 1   No facility-administered medications prior to visit.     ROS Review of Systems  Constitutional: Negative for activity change, appetite change and fatigue.  HENT: Negative for congestion, sinus pressure and sore throat.   Eyes: Negative for visual disturbance.  Respiratory: Negative for cough, chest tightness, shortness of breath and wheezing.   Cardiovascular: Negative for chest pain and palpitations.  Gastrointestinal: Negative for abdominal distention, abdominal pain and constipation.  Endocrine: Negative for polydipsia.  Genitourinary: Negative for dysuria and frequency.  Musculoskeletal:       Valgus deformity of both knees Left knee edema and tenderness on range of motion  Skin: Negative for rash.  Neurological: Negative for tremors, light-headedness and numbness.  Hematological: Does not bruise/bleed easily.  Psychiatric/Behavioral: Negative for agitation and behavioral problems.    Objective:  BP (!) 213/133 (BP Location: Left Arm, Patient Position: Sitting, Cuff Size: Normal)   Pulse 84   Temp 98 F (36.7 C) (Oral)   Resp 18   Ht 5' (1.524  m)   Wt 120 lb (54.4 kg)   SpO2 99%   BMI 23.44 kg/m   BP/Weight 06/23/2018 01/31/2018 06/03/6388  Systolic BP 373 428 768  Diastolic BP 115 92 92  Wt. (Lbs) 120 120.6 132.4  BMI 23.44 22.79 25.02     Physical Exam  Constitutional: She is oriented to person, place, and time. She appears well-developed and well-nourished.  Cardiovascular: Normal rate, normal heart sounds and intact  distal pulses.  No murmur heard. Pulmonary/Chest: Effort normal and breath sounds normal. She has no wheezes. She has no rales. She exhibits no tenderness.  Abdominal: Soft. Bowel sounds are normal. She exhibits no distension and no mass. There is no tenderness.  Musculoskeletal: Normal range of motion. She exhibits edema (2+ non pitting pedal edema).  Neurological: She is alert and oriented to person, place, and time.  Skin: Skin is warm and dry.  Psychiatric: She has a normal mood and affect.     CMP Latest Ref Rng & Units 02/04/2018 05/08/2017 11/07/2016  Glucose 65 - 99 mg/dL 82 80 92  BUN 8 - 27 mg/dL '12 13 14  '$ Creatinine 0.57 - 1.00 mg/dL 0.98 0.83 0.79  Sodium 134 - 144 mmol/L 145(H) 143 138  Potassium 3.5 - 5.2 mmol/L 3.3(L) 4.2 4.4  Chloride 96 - 106 mmol/L 102 103 101  CO2 20 - 29 mmol/L '29 26 29  '$ Calcium 8.7 - 10.3 mg/dL 9.0 9.4 9.0  Total Protein 6.0 - 8.5 g/dL 6.1 6.6 6.7  Total Bilirubin 0.0 - 1.2 mg/dL 0.5 0.6 0.5  Alkaline Phos 39 - 117 IU/L 170(H) 96 89  AST 0 - 40 IU/L '21 16 15  '$ ALT 0 - 32 IU/L '26 15 11    '$ Lipid Panel     Component Value Date/Time   CHOL 189 02/04/2018 0931   TRIG 69 02/04/2018 0931   HDL 63 02/04/2018 0931   CHOLHDL 3.0 02/04/2018 0931   CHOLHDL 3.6 10/11/2016 1110   VLDL 10 10/11/2016 1110   LDLCALC 112 (H) 02/04/2018 0931    Assessment & Plan:   1. Essential hypertension As elevated blood pressure Clonidine 0.2 mg administered and repeat performed after 30 minutes Switch from losartan to carvedilol due to cost Compliance with medications emphasized Counseled on blood pressure goal of less than 130/80, low-sodium, DASH diet, medication compliance, 150 minutes of moderate intensity exercise per week. Discussed medication compliance, adverse effects. - cloNIDine (CATAPRES) tablet 0.2 mg - carvedilol (COREG) 12.5 MG tablet; Take 1 tablet (12.5 mg total) by mouth 2 (two) times daily with a meal.  Dispense: 180 tablet; Refill: 1 -  CMP14+EGFR - Brain natriuretic peptide - DG Chest 2 View; Future - ECHOCARDIOGRAM COMPLETE; Future  2. Pedal edema Unknown etiology We will need to exclude cardiac etiology especially in the setting of cough - furosemide (LASIX) 20 MG tablet; Take 1 tablet (20 mg total) by mouth daily.  Dispense: 90 tablet; Refill: 1  3. Cough Discontinue losartan  4. Primary osteoarthritis of left knee Status post cortisone injections in the past Will benefit from total knee replacement however lack of medical coverage precludes this Followed by orthopedics - Dr. Marlou Sa - traMADol (ULTRAM) 50 MG tablet; Take 1 tablet (50 mg total) by mouth every 12 (twelve) hours as needed.  Dispense: 60 tablet; Refill: 1  5. Hypokalemia Last potassium was 3.3 - Potassium Chloride ER 20 MEQ TBCR; Take 20 mEq by mouth daily.  Dispense: 90 tablet; Refill: 1  6. Non compliance w medication  regimen Emphasized the need to comply with medications We have provided her with a pillbox which will help with compliance   Meds ordered this encounter  Medications  . cloNIDine (CATAPRES) tablet 0.2 mg  . carvedilol (COREG) 12.5 MG tablet    Sig: Take 1 tablet (12.5 mg total) by mouth 2 (two) times daily with a meal.    Dispense:  180 tablet    Refill:  1  . Potassium Chloride ER 20 MEQ TBCR    Sig: Take 20 mEq by mouth daily.    Dispense:  90 tablet    Refill:  1  . traMADol (ULTRAM) 50 MG tablet    Sig: Take 1 tablet (50 mg total) by mouth every 12 (twelve) hours as needed.    Dispense:  60 tablet    Refill:  1  . furosemide (LASIX) 20 MG tablet    Sig: Take 1 tablet (20 mg total) by mouth daily.    Dispense:  90 tablet    Refill:  1    Follow-up: Return in about 2 weeks (around 07/07/2018) for Follow-up of blood pressure with Lurena Joiner; follow-up with PCP 3 months.   Charlott Rakes MD

## 2018-06-23 NOTE — Patient Instructions (Signed)

## 2018-06-24 LAB — CMP14+EGFR
ALBUMIN: 3.4 g/dL — AB (ref 3.6–4.8)
ALT: 17 IU/L (ref 0–32)
AST: 19 IU/L (ref 0–40)
Albumin/Globulin Ratio: 1.6 (ref 1.2–2.2)
Alkaline Phosphatase: 132 IU/L — ABNORMAL HIGH (ref 39–117)
BILIRUBIN TOTAL: 0.8 mg/dL (ref 0.0–1.2)
BUN / CREAT RATIO: 13 (ref 12–28)
BUN: 11 mg/dL (ref 8–27)
CHLORIDE: 103 mmol/L (ref 96–106)
CO2: 27 mmol/L (ref 20–29)
Calcium: 8.8 mg/dL (ref 8.7–10.3)
Creatinine, Ser: 0.87 mg/dL (ref 0.57–1.00)
GFR calc Af Amer: 81 mL/min/{1.73_m2} (ref >59)
GFR calc non Af Amer: 70 mL/min/{1.73_m2} (ref >59)
GLUCOSE: 86 mg/dL (ref 65–99)
Globulin, Total: 2.1 g/dL (ref 1.5–4.5)
POTASSIUM: 3.1 mmol/L — AB (ref 3.5–5.2)
Sodium: 146 mmol/L — ABNORMAL HIGH (ref 134–144)
Total Protein: 5.5 g/dL — ABNORMAL LOW (ref 6.0–8.5)

## 2018-06-24 LAB — BRAIN NATRIURETIC PEPTIDE: BNP: 766.8 pg/mL — AB (ref 0.0–100.0)

## 2018-06-26 ENCOUNTER — Encounter (HOSPITAL_COMMUNITY): Payer: Self-pay

## 2018-06-26 ENCOUNTER — Inpatient Hospital Stay (HOSPITAL_COMMUNITY)
Admission: EM | Admit: 2018-06-26 | Discharge: 2018-06-28 | DRG: 292 | Disposition: A | Discharge status: Home / Self Care | Payer: Medicaid Other | Attending: Family Medicine | Admitting: Family Medicine

## 2018-06-26 ENCOUNTER — Other Ambulatory Visit (HOSPITAL_COMMUNITY): Payer: Self-pay

## 2018-06-26 ENCOUNTER — Telehealth: Payer: Self-pay

## 2018-06-26 ENCOUNTER — Other Ambulatory Visit: Payer: Self-pay

## 2018-06-26 ENCOUNTER — Emergency Department (HOSPITAL_COMMUNITY): Payer: Medicaid Other

## 2018-06-26 ENCOUNTER — Inpatient Hospital Stay (HOSPITAL_COMMUNITY): Payer: Medicaid Other

## 2018-06-26 DIAGNOSIS — Z23 Encounter for immunization: Secondary | ICD-10-CM | POA: Diagnosis not present

## 2018-06-26 DIAGNOSIS — Z79899 Other long term (current) drug therapy: Secondary | ICD-10-CM | POA: Diagnosis not present

## 2018-06-26 DIAGNOSIS — Z87891 Personal history of nicotine dependence: Secondary | ICD-10-CM | POA: Diagnosis not present

## 2018-06-26 DIAGNOSIS — I4581 Long QT syndrome: Secondary | ICD-10-CM | POA: Diagnosis present

## 2018-06-26 DIAGNOSIS — E7800 Pure hypercholesterolemia, unspecified: Secondary | ICD-10-CM

## 2018-06-26 DIAGNOSIS — Z6820 Body mass index (BMI) 20.0-20.9, adult: Secondary | ICD-10-CM

## 2018-06-26 DIAGNOSIS — R739 Hyperglycemia, unspecified: Secondary | ICD-10-CM | POA: Diagnosis present

## 2018-06-26 DIAGNOSIS — I509 Heart failure, unspecified: Secondary | ICD-10-CM

## 2018-06-26 DIAGNOSIS — E440 Moderate protein-calorie malnutrition: Secondary | ICD-10-CM | POA: Diagnosis present

## 2018-06-26 DIAGNOSIS — Z880 Allergy status to penicillin: Secondary | ICD-10-CM

## 2018-06-26 DIAGNOSIS — I5041 Acute combined systolic (congestive) and diastolic (congestive) heart failure: Secondary | ICD-10-CM | POA: Diagnosis present

## 2018-06-26 DIAGNOSIS — R600 Localized edema: Secondary | ICD-10-CM

## 2018-06-26 DIAGNOSIS — F1010 Alcohol abuse, uncomplicated: Secondary | ICD-10-CM | POA: Diagnosis present

## 2018-06-26 DIAGNOSIS — I5021 Acute systolic (congestive) heart failure: Secondary | ICD-10-CM

## 2018-06-26 DIAGNOSIS — E785 Hyperlipidemia, unspecified: Secondary | ICD-10-CM | POA: Diagnosis present

## 2018-06-26 DIAGNOSIS — M1712 Unilateral primary osteoarthritis, left knee: Secondary | ICD-10-CM | POA: Diagnosis present

## 2018-06-26 DIAGNOSIS — J810 Acute pulmonary edema: Secondary | ICD-10-CM

## 2018-06-26 DIAGNOSIS — I361 Nonrheumatic tricuspid (valve) insufficiency: Secondary | ICD-10-CM

## 2018-06-26 DIAGNOSIS — E876 Hypokalemia: Secondary | ICD-10-CM | POA: Diagnosis present

## 2018-06-26 DIAGNOSIS — Z9114 Patient's other noncompliance with medication regimen: Secondary | ICD-10-CM | POA: Diagnosis not present

## 2018-06-26 DIAGNOSIS — I110 Hypertensive heart disease with heart failure: Secondary | ICD-10-CM | POA: Diagnosis present

## 2018-06-26 DIAGNOSIS — I10 Essential (primary) hypertension: Secondary | ICD-10-CM | POA: Diagnosis present

## 2018-06-26 LAB — COMPREHENSIVE METABOLIC PANEL
ALT: 33 U/L (ref 0–44)
AST: 38 U/L (ref 15–41)
Albumin: 3 g/dL — ABNORMAL LOW (ref 3.5–5.0)
Alkaline Phosphatase: 156 U/L — ABNORMAL HIGH (ref 38–126)
Anion gap: 9 (ref 5–15)
BUN: 16 mg/dL (ref 8–23)
CHLORIDE: 108 mmol/L (ref 98–111)
CO2: 28 mmol/L (ref 22–32)
CREATININE: 1 mg/dL (ref 0.44–1.00)
Calcium: 8.3 mg/dL — ABNORMAL LOW (ref 8.9–10.3)
GFR, EST NON AFRICAN AMERICAN: 58 mL/min — AB (ref >60)
Glucose, Bld: 142 mg/dL — ABNORMAL HIGH (ref 70–99)
Potassium: 2.9 mmol/L — ABNORMAL LOW (ref 3.5–5.1)
Sodium: 145 mmol/L (ref 135–145)
Total Bilirubin: 1.2 mg/dL (ref 0.3–1.2)
Total Protein: 5.8 g/dL — ABNORMAL LOW (ref 6.5–8.1)

## 2018-06-26 LAB — CBC WITH DIFFERENTIAL/PLATELET
Abs Immature Granulocytes: 0.1 10*3/uL (ref 0.0–0.1)
BASOS ABS: 0.1 10*3/uL (ref 0.0–0.1)
Basophils Relative: 1 %
EOS ABS: 0.3 10*3/uL (ref 0.0–0.7)
EOS PCT: 3 %
HCT: 37 % (ref 36.0–46.0)
Hemoglobin: 11.7 g/dL — ABNORMAL LOW (ref 12.0–15.0)
Immature Granulocytes: 1 %
Lymphocytes Relative: 25 %
Lymphs Abs: 3 10*3/uL (ref 0.7–4.0)
MCH: 29.5 pg (ref 26.0–34.0)
MCHC: 31.6 g/dL (ref 30.0–36.0)
MCV: 93.4 fL (ref 78.0–100.0)
MONO ABS: 1 10*3/uL (ref 0.1–1.0)
Monocytes Relative: 8 %
NEUTROS ABS: 7.7 10*3/uL (ref 1.7–7.7)
Neutrophils Relative %: 62 %
PLATELETS: 317 10*3/uL (ref 150–400)
RBC: 3.96 MIL/uL (ref 3.87–5.11)
RDW: 13.2 % (ref 11.5–15.5)
WBC: 12.1 10*3/uL — ABNORMAL HIGH (ref 4.0–10.5)

## 2018-06-26 LAB — CBG MONITORING, ED: Glucose-Capillary: 85 mg/dL (ref 70–99)

## 2018-06-26 LAB — BRAIN NATRIURETIC PEPTIDE: B NATRIURETIC PEPTIDE 5: 917.7 pg/mL — AB (ref 0.0–100.0)

## 2018-06-26 LAB — TROPONIN I
TROPONIN I: 0.06 ng/mL — AB (ref <0.03)
Troponin I: 0.04 ng/mL (ref <0.03)

## 2018-06-26 LAB — HEMOGLOBIN A1C
Hgb A1c MFr Bld: 5 % (ref 4.8–5.6)
MEAN PLASMA GLUCOSE: 96.8 mg/dL

## 2018-06-26 LAB — I-STAT TROPONIN, ED: Troponin i, poc: 0.03 ng/mL (ref 0.00–0.08)

## 2018-06-26 LAB — ECHOCARDIOGRAM COMPLETE

## 2018-06-26 LAB — GLUCOSE, CAPILLARY
GLUCOSE-CAPILLARY: 75 mg/dL (ref 70–99)
Glucose-Capillary: 64 mg/dL — ABNORMAL LOW (ref 70–99)
Glucose-Capillary: 107 mg/dL — ABNORMAL HIGH (ref 70–99)

## 2018-06-26 LAB — HIV ANTIBODY (ROUTINE TESTING W REFLEX): HIV SCREEN 4TH GENERATION: NONREACTIVE

## 2018-06-26 MED ORDER — HYDRALAZINE HCL 20 MG/ML IJ SOLN
5.0000 mg | INTRAMUSCULAR | Status: DC | PRN
  Administered 2018-06-26 – 2018-06-28 (×2): 5 mg via INTRAVENOUS
  Filled 2018-06-26 (×2): qty 1

## 2018-06-26 MED ORDER — SODIUM CHLORIDE 0.9% FLUSH
3.0000 mL | Freq: Two times a day (BID) | INTRAVENOUS | Status: DC
  Administered 2018-06-26 – 2018-06-28 (×5): 3 mL via INTRAVENOUS

## 2018-06-26 MED ORDER — TRAMADOL HCL 50 MG PO TABS: 50.0000 mg | ORAL_TABLET | Freq: Two times a day (BID) | ORAL | Status: DC | PRN

## 2018-06-26 MED ORDER — ENOXAPARIN SODIUM 40 MG/0.4ML ~~LOC~~ SOLN
40.0000 mg | SUBCUTANEOUS | Status: DC
  Administered 2018-06-26 – 2018-06-27 (×2): 40 mg via SUBCUTANEOUS
  Filled 2018-06-26 (×2): qty 0.4

## 2018-06-26 MED ORDER — ENSURE ENLIVE PO LIQD
237.0000 mL | Freq: Two times a day (BID) | ORAL | Status: DC
  Administered 2018-06-26 – 2018-06-27 (×3): 237 mL via ORAL

## 2018-06-26 MED ORDER — NITROGLYCERIN 2 % TD OINT
1.0000 [in_us] | TOPICAL_OINTMENT | Freq: Once | TRANSDERMAL | Status: AC
  Administered 2018-06-26: 1 [in_us] via TOPICAL
  Filled 2018-06-26: qty 1

## 2018-06-26 MED ORDER — TRAMADOL HCL 50 MG PO TABS
50.0000 mg | ORAL_TABLET | Freq: Four times a day (QID) | ORAL | Status: DC | PRN
  Administered 2018-06-26: 50 mg via ORAL
  Filled 2018-06-26: qty 1

## 2018-06-26 MED ORDER — POTASSIUM CHLORIDE CRYS ER 20 MEQ PO TBCR
20.0000 meq | EXTENDED_RELEASE_TABLET | Freq: Every day | ORAL | Status: DC
Start: 2018-06-26 — End: 2018-06-28
  Administered 2018-06-26 – 2018-06-28 (×3): 20 meq via ORAL
  Filled 2018-06-26 (×4): qty 1

## 2018-06-26 MED ORDER — PNEUMOCOCCAL VAC POLYVALENT 25 MCG/0.5ML IJ INJ
0.5000 mL | INJECTION | INTRAMUSCULAR | Status: AC
  Administered 2018-06-27: 0.5 mL via INTRAMUSCULAR
  Filled 2018-06-26: qty 0.5

## 2018-06-26 MED ORDER — LORATADINE 10 MG PO TABS
10.0000 mg | ORAL_TABLET | Freq: Every day | ORAL | Status: DC
  Administered 2018-06-26 – 2018-06-28 (×3): 10 mg via ORAL
  Filled 2018-06-26 (×3): qty 1

## 2018-06-26 MED ORDER — CARVEDILOL 12.5 MG PO TABS
12.5000 mg | ORAL_TABLET | Freq: Two times a day (BID) | ORAL | Status: DC
  Administered 2018-06-26 – 2018-06-28 (×5): 12.5 mg via ORAL
  Filled 2018-06-26 (×5): qty 1

## 2018-06-26 MED ORDER — ACETAMINOPHEN 325 MG PO TABS: 650.0000 mg | ORAL_TABLET | ORAL | Status: DC | PRN

## 2018-06-26 MED ORDER — SODIUM CHLORIDE 0.9% FLUSH: 3.0000 mL | INTRAVENOUS | Status: DC | PRN

## 2018-06-26 MED ORDER — LISINOPRIL 5 MG PO TABS
5.0000 mg | ORAL_TABLET | Freq: Every day | ORAL | Status: DC
  Administered 2018-06-26 – 2018-06-27 (×2): 5 mg via ORAL
  Filled 2018-06-26: qty 2
  Filled 2018-06-26: qty 1

## 2018-06-26 MED ORDER — FUROSEMIDE 10 MG/ML IJ SOLN
40.0000 mg | Freq: Once | INTRAMUSCULAR | Status: AC
  Administered 2018-06-26: 40 mg via INTRAVENOUS
  Filled 2018-06-26: qty 4

## 2018-06-26 MED ORDER — POTASSIUM CHLORIDE CRYS ER 20 MEQ PO TBCR
40.0000 meq | EXTENDED_RELEASE_TABLET | Freq: Once | ORAL | Status: AC
  Administered 2018-06-26: 40 meq via ORAL
  Filled 2018-06-26: qty 2

## 2018-06-26 MED ORDER — FUROSEMIDE 10 MG/ML IJ SOLN
40.0000 mg | Freq: Two times a day (BID) | INTRAMUSCULAR | Status: AC
  Administered 2018-06-26 – 2018-06-27 (×3): 40 mg via INTRAVENOUS
  Filled 2018-06-26 (×3): qty 4

## 2018-06-26 MED ORDER — ONDANSETRON HCL 4 MG/2ML IJ SOLN: 4.0000 mg | Freq: Four times a day (QID) | INTRAMUSCULAR | Status: DC | PRN

## 2018-06-26 MED ORDER — ASPIRIN EC 81 MG PO TBEC
81.0000 mg | DELAYED_RELEASE_TABLET | Freq: Every day | ORAL | Status: DC
  Administered 2018-06-26 – 2018-06-28 (×3): 81 mg via ORAL
  Filled 2018-06-26 (×3): qty 1

## 2018-06-26 MED ORDER — INSULIN ASPART 100 UNIT/ML ~~LOC~~ SOLN: 0.0000 [IU] | Freq: Three times a day (TID) | SUBCUTANEOUS | Status: DC

## 2018-06-26 MED ORDER — POTASSIUM CHLORIDE 10 MEQ/100ML IV SOLN
10.0000 meq | INTRAVENOUS | Status: AC
  Administered 2018-06-26 (×3): 10 meq via INTRAVENOUS
  Filled 2018-06-26 (×3): qty 100

## 2018-06-26 MED ORDER — POTASSIUM CHLORIDE ER 20 MEQ PO TBCR: 20.0000 meq | EXTENDED_RELEASE_TABLET | Freq: Every day | ORAL | Status: DC

## 2018-06-26 MED ORDER — ATORVASTATIN CALCIUM 20 MG PO TABS
20.0000 mg | ORAL_TABLET | Freq: Every day | ORAL | Status: DC
  Administered 2018-06-26 – 2018-06-27 (×2): 20 mg via ORAL
  Filled 2018-06-26 (×3): qty 1

## 2018-06-26 MED ORDER — SODIUM CHLORIDE 0.9 % IV SOLN: 250.0000 mL | INTRAVENOUS | Status: DC | PRN

## 2018-06-26 MED ORDER — NITROGLYCERIN IN D5W 200-5 MCG/ML-% IV SOLN
0.0000 ug/min | Freq: Once | INTRAVENOUS | Status: DC
  Filled 2018-06-26: qty 250

## 2018-06-26 NOTE — ED Provider Notes (Signed)
[facility] EMERGENCY DEPARTMENT Provider Note   CSN: [id number] Arrival date & time: 06/26/18  7829     History   Chief Complaint Chief Complaint  Patient presents with  . Respiratory Distress  . Chest Pain    HPI Brandy Bond is a 67 y.o. female.  The history is provided by the patient and the EMS personnel.  She has history of hypertension, and is brought in by ambulance because of difficulty breathing.  She noted onset difficulty breathing during the night.  There is associated pressure in her chest but no nausea, vomiting, diaphoresis.  Dyspnea is worse laying down, better sitting up.  She had been seen in her doctor's office a few days ago after having run out of her blood pressure medicine, and she was started back on her medication.  EMS gave aspirin and nitroglycerin.  They noted initial oxygen saturation of 84%, and placed her on a nonrebreather oxygen mask.  Past Medical History:  Diagnosis Date  . Hypertension     Patient Active Problem List   Diagnosis Date Noted  . Osteoarthritis of left knee 10/11/2016  . Hypertension 09/18/2016  . CONSTIPATION, CHRONIC 10/11/2010    History reviewed. No pertinent surgical history.   OB History   None      Home Medications    Prior to Admission medications   Medication Sig Start Date End Date Taking? Authorizing Provider  Aspirin-Caffeine (BAYER BACK & BODY PAIN EX ST) 500-32.5 MG TABS Take 1 tablet by mouth as needed (for pain).    [provider]  carvedilol (COREG) 12.5 MG tablet Take 1 tablet (12.5 mg total) by mouth 2 (two) times daily with a meal. 06/23/18   Hoy Register, MD  cetirizine (ZYRTEC) 10 MG tablet Take 1 tablet (10 mg total) by mouth daily. 01/31/18   Hoy Register, MD  furosemide (LASIX) 20 MG tablet Take 1 tablet (20 mg total) by mouth daily. 06/23/18   Hoy Register, MD  Iron-Vitamins (GERITOL COMPLETE PO) Take 1 tablet by mouth daily.      [provider]    naproxen (NAPROSYN) 500 MG tablet Take 1 tablet (500 mg total) by mouth 2 (two) times daily with a meal. Patient not taking: Reported on 01/31/2018 09/18/16   Hoy Register, MD  Potassium Chloride ER 20 MEQ TBCR Take 20 mEq by mouth daily. 06/23/18   Hoy Register, MD  traMADol (ULTRAM) 50 MG tablet Take 1 tablet (50 mg total) by mouth every 12 (twelve) hours as needed. 06/23/18   Hoy Register, MD    Family History History reviewed. No pertinent family history.  Social History Social History   Tobacco Use  . Smoking status: Never Smoker  . Smokeless tobacco: Never Used  Substance Use Topics  . Alcohol use: Yes    Alcohol/week: 1.2 - 1.8 oz    Types: 2 - 3 Cans of beer per week    Comment: occasionally  . Drug use: No     Allergies   Penicillins   Review of Systems Review of Systems  All other systems reviewed and are negative.    Physical Exam Updated Vital Signs BP (!) 186/111   Pulse 88   Temp 98.2 F (36.8 C) (Axillary)   Resp (!) 31   SpO2 100%   Physical Exam  Nursing note and vitals reviewed.  67 year old female, resting comfortably and in no acute distress.  She has a nonrebreather oxygen mask in place.  She is  getting oxygen via nonrebreather mask.  Vital signs are significant for hypertension and tachypnea. Oxygen saturation is 100%, which is normal. Head is normocephalic and atraumatic. PERRLA, EOMI. Oropharynx is clear. Neck is nontender and supple without adenopathy. JVD is present at 90 degrees. Back is nontender and there is no CVA tenderness. Lungs have bibasilar rales going about halfway up, wheezes, or rhonchi. Chest is nontender. Heart has regular rate and rhythm without murmur. Abdomen is soft, flat, nontender without masses or hepatosplenomegaly and peristalsis is normoactive. Extremities have 1+ edema, full range of motion is present. Skin is warm and dry without rash. Neurologic: Mental status is normal, cranial nerves are intact,  there are no motor or sensory deficits.  ED Treatments / Results  Labs (all labs ordered are listed, but only abnormal results are displayed) Labs Reviewed  COMPREHENSIVE METABOLIC PANEL - Abnormal; Notable for the following components:      Result Value   Potassium 2.9 (*)    Glucose, Bld 142 (*)    Calcium 8.3 (*)    Total Protein 5.8 (*)    Albumin 3.0 (*)    Alkaline Phosphatase 156 (*)    GFR calc non Af Amer 58 (*)    All other components within normal limits  CBC WITH DIFFERENTIAL/PLATELET - Abnormal; Notable for the following components:   WBC 12.1 (*)    Hemoglobin 11.7 (*)    All other components within normal limits  BRAIN NATRIURETIC PEPTIDE - Abnormal; Notable for the following components:   B Natriuretic Peptide 917.7 (*)    All other components within normal limits  I-STAT TROPONIN, ED    EKG EKG Interpretation  Date/Time:  Thursday June 26 2018 06:21:48 EDT Ventricular Rate:  89 PR Interval:    QRS Duration: 93 QT Interval:  409 QTC Calculation: 498 R Axis:   62 Text Interpretation:  Sinus rhythm Left atrial enlargement LVH with secondary repolarization abnormality Borderline prolonged QT interval When compared with ECG of 04/22/2010, QT has lengthened REPOLARIZATION ABNORMALITY is now present Confirmed by Dione Booze ([id number]) on 06/26/2018 6:27:17 AM   Radiology Dg Chest Port 1 View  Result Date: 06/26/2018 CLINICAL DATA:  Shortness of breath. EXAM: PORTABLE CHEST 1 VIEW COMPARISON:  12/14/2011. FINDINGS: Cardiomegaly with diffuse bilateral pulmonary infiltrates consistent with pulmonary edema. Small bilateral pleural effusions. These findings suggest CHF. No pneumothorax. No acute bony abnormality. Degenerative changes both shoulders. IMPRESSION: Congestive heart failure with bilateral pulmonary edema and small bilateral pleural effusions. Electronically Signed   By: Maisie Fus  Register   On: 06/26/2018 07:07    Procedures Procedures  CRITICAL  CARE Performed by: Dione Booze Total critical care time: 45 minutes Critical care time was exclusive of separately billable procedures and treating other patients. Critical care was necessary to treat or prevent imminent or life-threatening deterioration. Critical care was time spent personally by me on the following activities: development of treatment plan with patient and/or surrogate as well as nursing, discussions with consultants, evaluation of patient's response to treatment, examination of patient, obtaining history from patient or surrogate, ordering and performing treatments and interventions, ordering and review of laboratory studies, ordering and review of radiographic studies, pulse oximetry and re-evaluation of patient's condition.  Medications Ordered in ED Medications  potassium chloride 10 mEq in 100 mL IVPB (10 mEq Intravenous New Bag/Given 06/26/18 0748)  nitroGLYCERIN (NITROGLYN) 2 % ointment 1 inch (has no administration in time range)  furosemide (LASIX) injection 40 mg (40 mg Intravenous Given 06/26/18 0644)  potassium  chloride SA (K-DUR,KLOR-CON) CR tablet 40 mEq (40 mEq Oral Given 06/26/18 0739)     Initial Impression / Assessment and Plan / ED Course  I have reviewed the triage vital signs and the nursing notes.  Pertinent labs & imaging results that were available during my care of the patient were reviewed by me and considered in my medical decision making (see chart for details).  Acute dyspnea which appears on exam to be pulmonary edema.  Old records are reviewed and she has no prior history of pulmonary edema.  She did have an office visit 3 days ago at which time peripheral edema was noted, and she was given prescription for clonidine, carvedilol, potassium, tramadol, furosemide.  She will be given additional furosemide here, chest x-ray is been ordered as well as laboratory work-up.  Family has arrived.  Apparently, her prescriptions had not been picked up, so she  is not on diuretic.  Because her of her hypokalemia is not clear.  She continues hypertensive and is started on nitroglycerin drip.  However, oxygen requirement has decreased and she is now comfortable and maintaining adequate oxygen saturation on nasal cannula.  Case is discussed with Dr. Ophelia Charter of [facility], who agrees to admit the patient, requests nitroglycerin be changed from intravenous to topical.  Final Clinical Impressions(s) / ED Diagnoses   Final diagnoses:  Acute pulmonary edema [facility])  Hypokalemia    ED Discharge Orders    None       Dione Booze, MD 06/26/18 [phone]

## 2018-06-26 NOTE — Progress Notes (Signed)
  Echocardiogram 2D Echocardiogram has been performed.  Brandy Bond L Androw 06/26/2018, 2:14 PM

## 2018-06-26 NOTE — Evaluation (Addendum)
Physical Therapy Evaluation Patient Details Name: Brandy Bond MRN: [id number] DOB: [birth year] Today's Date: 06/26/2018   History of Present Illness  Pt is a 67 y/o female admitted secondary to SOB, found to have new onset CHF. PMH including but not limited to HTN (medical non-compliance).    Clinical Impression  Pt presented supine in bed with HOB elevated, awake and willing to participate in therapy session. Prior to admission, pt reported that she ambulated with use of SPC. Pt lives alone in a single level apartment with three steps to enter. Pt currently able to perform bed mobility with supervision, transfers with min A (without AD) to min guard (with RW) and ambulated in hallway with RW and min A. Pt limited at this time secondary to increasing L knee pain. Pt with no reports of SOB or difficulty breathing throughout. Pt on RA with SPO at 90-91%.   Pt would continue to benefit from skilled physical therapy services at this time while admitted and after d/c to address the below listed limitations in order to improve overall safety and independence with functional mobility.     Follow Up Recommendations Home health PT;Supervision for mobility/OOB    Equipment Recommendations  Rolling walker with 5" wheels    Recommendations for Other Services       Precautions / Restrictions Precautions Precautions: Fall Restrictions Weight Bearing Restrictions: No      Mobility  Bed Mobility Overal bed mobility: Needs Assistance Bed Mobility: Supine to Sit;Sit to Supine     Supine to sit: Supervision Sit to supine: Supervision   General bed mobility comments: increased time and effort, supervision for safety  Transfers Overall transfer level: Needs assistance Equipment used: None;Rolling walker (2 wheeled) Transfers: Sit to/from Stand Sit to Stand: Min assist;Min guard         General transfer comment: pt performed sit-to-stand from EOB with min A for stability without use of an  AD; also performed from straight back chair in room with RW and min guard   Ambulation/Gait Ambulation/Gait assistance: Min assist Gait Distance (Feet): 75 Feet Assistive device: 1 person hand held assist;Rolling walker (2 wheeled) Gait Pattern/deviations: Step-to pattern;Step-through pattern;Decreased step length - right;Decreased step length - left;Decreased stride length;Decreased weight shift to left;Antalgic;Trunk flexed Gait velocity: decreased Gait velocity interpretation: 1.31 - 2.62 ft/sec, indicative of limited community ambulator General Gait Details: pt ambulated ~5' with 1HHA to simulate SPC; however, pt with modest instability and requiring more support secondary to L knee pain. pt then ambulated ~70' with RW and min A for safety and stability with verbal and tactile cueing to maintain a safe distance from [employer] Mobility    Modified Rankin (Stroke Patients Only)       Balance Overall balance assessment: Needs assistance Sitting-balance support: Feet supported Sitting balance-Leahy Scale: Good     Standing balance support: During functional activity;No upper extremity supported Standing balance-Leahy Scale: Fair                               Pertinent Vitals/Pain Pain Assessment: Faces Faces Pain Scale: Hurts little more Pain Location: L knee Pain Descriptors / Indicators: Sore Pain Intervention(s): Monitored during session;Repositioned    Home Living Family/patient expects to be discharged to:: Private residence Living Arrangements: Alone Available Help at Discharge: Family;Available PRN/intermittently Type of Home: Apartment Home Access: Stairs to enter Entrance Stairs-Rails: Left  Entrance Stairs-Number of Steps: 3 Home Layout: One level Home Equipment: Cane - single point      Prior Function Level of Independence: Independent with assistive device(s)         Comments: ambulates with SPC/"walking stick"      Hand Dominance        Extremity/Trunk Assessment   Upper Extremity Assessment Upper Extremity Assessment: Generalized weakness    Lower Extremity Assessment Lower Extremity Assessment: Generalized weakness;LLE deficits/detail LLE Deficits / Details: pt with edema in knee and painful; pt unable to fully WB through L LE with standing or gait LLE: Unable to fully assess due to pain       Communication   Communication: No difficulties  Cognition Arousal/Alertness: Awake/alert Behavior During Therapy: WFL for tasks assessed/performed Overall Cognitive Status: Within Functional Limits for tasks assessed                                        General Comments      Exercises     Assessment/Plan    PT Assessment Patient needs continued PT services  PT Problem List Decreased strength;Decreased range of motion;Decreased activity tolerance;Decreased mobility;Decreased balance;Decreased coordination;Decreased knowledge of use of DME;Decreased safety awareness;Pain;Decreased knowledge of precautions;Cardiopulmonary status limiting activity       PT Treatment Interventions DME instruction;Gait training;Stair training;Functional mobility training;Therapeutic activities;Therapeutic exercise;Balance training;Neuromuscular re-education;Patient/family education    PT Goals (Current goals can be found in the Care Plan section)  Acute Rehab PT Goals Patient Stated Goal: decrease pain in L knee PT Goal Formulation: With patient Time For Goal Achievement: 07/10/18 Potential to Achieve Goals: Good    Frequency Min 3X/week   Barriers to discharge        Co-evaluation               AM-PAC PT "6 Clicks" Daily Activity  Outcome Measure Difficulty turning over in bed (including adjusting bedclothes, sheets and blankets)?: None Difficulty moving from lying on back to sitting on the side of the bed? : None Difficulty sitting down on and standing up from a chair  with arms (e.g., wheelchair, bedside commode, etc,.)?: Unable Help needed moving to and from a bed to chair (including a wheelchair)?: A Little Help needed walking in hospital room?: A Little Help needed climbing 3-5 steps with a railing? : A Little 6 Click Score: 18    End of Session   Activity Tolerance: Patient limited by pain Patient left: in bed;with call bell/phone within reach;with bed alarm set Nurse Communication: Mobility status PT Visit Diagnosis: Other abnormalities of gait and mobility (R26.89);Pain Pain - Right/Left: Left Pain - part of body: Knee    Time: 5784-6962 PT Time Calculation (min) (ACUTE ONLY): 18 min   Charges:   PT Evaluation $PT Eval Moderate Complexity: 1 Mod     PT G Codes:        [place], PT, DPT [phone]   Alessandra Bevels Shamya Macfadden 06/26/2018, 3:44 PM

## 2018-06-26 NOTE — ED Notes (Signed)
Pt transported to ECHO. 

## 2018-06-26 NOTE — ED Notes (Signed)
Meal tray delivered.

## 2018-06-26 NOTE — ED Notes (Signed)
Admitting paged regarding elevated troponin

## 2018-06-26 NOTE — ED Notes (Signed)
Purewick in place and hooked up to suction canister.  

## 2018-06-26 NOTE — Progress Notes (Signed)
Completed pt admission database with pt sister at bedside

## 2018-06-26 NOTE — Telephone Encounter (Signed)
Patient was called and informed to contact office for lab results. 

## 2018-06-26 NOTE — ED Triage Notes (Addendum)
Pt arrived via [facility], per EMS pt from home with c/o CP, states that its a pressure in central chest; pt stated that she began having SOB upon ambulating trying to alleviate pain; EMS applied non-rebreather at 12L; pt rec'd 324 mg ASA and 1 nitro; O2 sats 98% with NB but 84% on RA; pt doesn't wear O2 at home; 238/142, 96 HR, 30 R, CBG 131

## 2018-06-26 NOTE — H&P (Signed)
History and Physical    Brandy Bond:[id number] DOB: November 30, 1951 DOA: 06/26/2018  PCP: Hoy Register, MD Consultants:  August Saucer - orthopedics; has a cardiology appt next week Patient coming from:  Home - lives alone; NOK: Sister and her husband, [phone]  Chief Complaint: SOB  HPI: Brandy Bond is a 67 y.o. female with medical history significant of HTN noncompliant with medications; and OA of the left knee presenting with CP and SOB.  "I couldn't breathe."  It started about 0400.  She had been sleeping and her hand was hurting and woke her up.  She became acutely SOB.  She had some chest tightness.  +LE edema.  ?orthopnea.  No PND.  She was given medications from her PCP and she did not pick them up.    She is already feeling better since being given Lasix in the ER and she has diuresed over a liter.  She has never had an Echo.   ED Course:  New-onset heart failure with pulmonary edema.  Seen by PCP a couple of days ago - out of meds.  Ordered Lasix, but did not pick up medication.  O2 84% on RA, neck distention, bibasilar rales.  K+ 2.9, not on diuretic.  On NTG drip for BP control, can change to nitropaste.  Review of Systems: As per HPI; otherwise review of systems reviewed and negative.   Ambulatory Status:  Ambulates without assistance  Past Medical History:  Diagnosis Date  . Hypertension     Past Surgical History:  Procedure Laterality Date  . HAND SURGERY      Social History   Socioeconomic History  . Marital status: Single    Spouse name: Not on file  . Number of children: Not on file  . Years of education: Not on file  . Highest education level: Not on file  Occupational History  . Occupation: retired  Engineer, production  . Financial resource strain: Not on file  . Food insecurity:    Worry: Not on file    Inability: Not on file  . Transportation needs:    Medical: Not on file    Non-medical: Not on file  Tobacco Use  . Smoking status: Former Smoker    Years:  5.00  . Smokeless tobacco: Never Used  . Tobacco comment: more than 10 years ago  Substance and Sexual Activity  . Alcohol use: Not Currently    Alcohol/week: 1.2 - 1.8 oz    Types: 2 - 3 Cans of beer per week    Comment: occasionally  . Drug use: No  . Sexual activity: Not Currently  Lifestyle  . Physical activity:    Days per week: Not on file    Minutes per session: Not on file  . Stress: Not on file  Relationships  . Social connections:    Talks on phone: Not on file    Gets together: Not on file    Attends religious service: Not on file    Active member of club or organization: Not on file    Attends meetings of clubs or organizations: Not on file    Relationship status: Not on file  . Intimate partner violence:    Fear of current or ex partner: Not on file    Emotionally abused: Not on file    Physically abused: Not on file    Forced sexual activity: Not on file  Other Topics Concern  . Not on file  Social History Narrative  . Not on  file    Allergies  Allergen Reactions  . Penicillins Shortness Of Breath, Itching and Swelling    Has patient had a PCN reaction causing immediate rash, facial/tongue/throat swelling, SOB or lightheadedness with hypotension: yes Has patient had a PCN reaction causing severe rash involving mucus membranes or skin necrosis: no Has patient had a PCN reaction that required hospitalization: unknown Has patient had a PCN reaction occurring within the last 10 years: no If all of the above answers are "NO", then may proceed with Cephalosporin use.     Family History  Problem Relation Age of Onset  . Heart failure Neg Hx   . CAD Neg Hx     Prior to Admission medications   Medication Sig Start Date End Date Taking? Authorizing Provider  Aspirin-Caffeine (BAYER BACK & BODY PAIN EX ST) 500-32.5 MG TABS Take 1 tablet by mouth as needed (for pain).    [provider]  carvedilol (COREG) 12.5 MG tablet Take 1 tablet (12.5 mg total)  by mouth 2 (two) times daily with a meal. 06/23/18   Hoy Register, MD  cetirizine (ZYRTEC) 10 MG tablet Take 1 tablet (10 mg total) by mouth daily. 01/31/18   Hoy Register, MD  furosemide (LASIX) 20 MG tablet Take 1 tablet (20 mg total) by mouth daily. 06/23/18   Hoy Register, MD  Iron-Vitamins (GERITOL COMPLETE PO) Take 1 tablet by mouth daily.      [provider]  naproxen (NAPROSYN) 500 MG tablet Take 1 tablet (500 mg total) by mouth 2 (two) times daily with a meal. Patient not taking: Reported on 01/31/2018 09/18/16   Hoy Register, MD  Potassium Chloride ER 20 MEQ TBCR Take 20 mEq by mouth daily. 06/23/18   Hoy Register, MD  traMADol (ULTRAM) 50 MG tablet Take 1 tablet (50 mg total) by mouth every 12 (twelve) hours as needed. 06/23/18   Hoy Register, MD    Physical Exam: Vitals:   06/26/18 0915 06/26/18 0930 06/26/18 0945 06/26/18 1000  BP: (!) 168/99 (!) 174/102 (!) 178/110 (!) 178/109  Pulse: 87 84 81 84  Resp: (!) 24 (!) 29 (!) 22 (!) 24  Temp:      TempSrc:      SpO2: 100% 100% 99% 97%     General:  Appears calm and comfortable and is NAD Eyes:  PERRL, EOMI, normal lids, iris ENT:  grossly normal hearing, lips & tongue, mmm Neck:  no LAD, masses or thyromegaly; no carotid bruits Cardiovascular:  RRR, no m/r/g. 2+ LE edema - this already appears to be improving.  Respiratory:   CTA bilaterally with no wheezes/rales/rhonchi.  Normal respiratory effort. Abdomen:  soft, NT, ND, NABS Back:   normal alignment, no CVAT Skin:  no rash or induration seen on limited exam Musculoskeletal:  grossly normal tone BUE/BLE, good ROM, no bony abnormality Psychiatric:  grossly normal mood and affect, speech fluent and appropriate, AOx3 Neurologic:  CN 2-12 grossly intact, moves all extremities in coordinated fashion, sensation intact    Radiological Exams on Admission: Dg Chest Port 1 View  Result Date: 06/26/2018 CLINICAL DATA:  Shortness of breath. EXAM: PORTABLE  CHEST 1 VIEW COMPARISON:  12/14/2011. FINDINGS: Cardiomegaly with diffuse bilateral pulmonary infiltrates consistent with pulmonary edema. Small bilateral pleural effusions. These findings suggest CHF. No pneumothorax. No acute bony abnormality. Degenerative changes both shoulders. IMPRESSION: Congestive heart failure with bilateral pulmonary edema and small bilateral pleural effusions. Electronically Signed   By: Maisie Fus  Register   On: 06/26/2018 07:07  EKG: Independently reviewed.  NSR with rate 89; LAE, LVH, nonspecific ST changes with no evidence of acute ischemia   Labs on Admission: I have personally reviewed the available labs and imaging studies at the time of the admission.  Pertinent labs:   K+ 2.9 Glucose 142 AP 156 Albumin 3 Troponin 0.03 BNP from today is pending but it was 766.8 on 7/15 WBC 12.1 Hgb 11.7  Assessment/Plan Principal Problem:   Congestive heart failure (CHF) (HCC) Active Problems:   Hypertension   Hypokalemia   Hyperglycemia   Hyperlipidemia   New-onset CHF -Patient with minimal smoking history and without prior h/o respiratory failure presenting with worsening SOB and LE edema -CXR consistent with pulmonary edema -Elevated BNP -With elevated BNP and abnl CXR, new-onset CHF seems most probable as diagnosis -Will admit with telemetry -Will request echocardiogram -Will start ASA -Will start Lisinopril 5 mg daily -Continue Coreg - although patient is noncompliant with medications and so does not appear to have been taking this -CHF order set utilized; may need CHF team consult but will hold until Echo results are available -Was given Lasix 40 mg x 1 in ER and will repeat with 40 mg IV BID; she is diuresing extremely well so far (>2L negative in the ER) -Continue [place] O2 prn for now -Normal kidney function at this time, will follow -Repeat EKG in AM -Will r/o with serial troponins although doubt ACS based on symptoms  HTN -Patient has had  difficulty affording medications and has been extremely noncompliant -She understands that she MUST take medications or she will have ongoing, life-threatening progression of her disease -Home Coreg continued -Lisinopril added -Will also use prn IV hydralazine  Hyperglycemia -Denies known h/o DM -May be stress response -Will follow with fasting AM labs and check A1c and blood glucose -Will provide sensitive-scale SSI for prn use  Hypokalemia -It is not clear why she has hypokalemia prior to onset of diuresis (she has not started home Lasix) -She was repleted with IV KCl and 40 mEq PO KCl in the ER -Will continue daily 20 mEq PO KCl -Recheck in AM  Hyperlipidemia -Lipids checked in 2/19: 189/63/112/69 -With presumed new-onset CHF, will add Lipitor 20 mg daily for now  Other -There do not appear to be cultural barriers (she is [place] and speaks blended [place]) to her obtaining medical care/medications -Famlyl does report some difficulty in her affording medications -Medications may be available through the [employer] and [employer], or she may be able to obtain home medications from the [employer] list -CM consult requested  DVT prophylaxis:  Lovenox  Code Status:  Full - confirmed with patient/family Family Communication: Sister and brother-in-law were present throughout encounter Disposition Plan:  Home once clinically improved Consults called: CM/PT  Admission status: Admit - It is my clinical opinion that admission to INPATIENT is reasonable and necessary because of the expectation that this patient will require hospital care that crosses at least 2 midnights to treat this condition based on the medical complexity of the problems presented.  Given the aforementioned information, the predictability of an adverse outcome is felt to be significant.    Jonah Blue MD [facility]  If note is complete, please contact covering daytime or nighttime  physician. [email or web address] Password [email or web address]  06/26/2018, 10:18 AM

## 2018-06-26 NOTE — Progress Notes (Signed)
Pt CBG was 64 but asymptomatic, pt given drink per protocol and food; CBG up to 107. Will continue to closely monitor. Dionne Bucy RN

## 2018-06-26 NOTE — ED Notes (Signed)
Pt given apple juice  

## 2018-06-26 NOTE — Progress Notes (Signed)
Pt admitted to the unit from ED; pt A&O x4; MAE x4; pt oriented to the unit and room; fall/safety precaution and prevention education completed. Pt skin clean, dry and intact with no pressure ulcers or opened wounds noted; skin assessment completed with RN Cristin per protocol. Pt VSS; telemetry applied and verified with CCMD: NT called to second verify. Pt in bed with call light within reach. Will continue to closely monitor. Dionne Bucy RN

## 2018-06-27 ENCOUNTER — Telehealth: Payer: Self-pay

## 2018-06-27 DIAGNOSIS — E440 Moderate protein-calorie malnutrition: Secondary | ICD-10-CM

## 2018-06-27 LAB — CBC WITH DIFFERENTIAL/PLATELET
Abs Immature Granulocytes: 0 10*3/uL (ref 0.0–0.1)
BASOS PCT: 1 %
Basophils Absolute: 0.1 10*3/uL (ref 0.0–0.1)
EOS ABS: 0.4 10*3/uL (ref 0.0–0.7)
Eosinophils Relative: 4 %
HCT: 38.7 % (ref 36.0–46.0)
Hemoglobin: 12.4 g/dL (ref 12.0–15.0)
IMMATURE GRANULOCYTES: 0 %
Lymphocytes Relative: 34 %
Lymphs Abs: 2.8 10*3/uL (ref 0.7–4.0)
MCH: 29.2 pg (ref 26.0–34.0)
MCHC: 32 g/dL (ref 30.0–36.0)
MCV: 91.3 fL (ref 78.0–100.0)
Monocytes Absolute: 0.9 10*3/uL (ref 0.1–1.0)
Monocytes Relative: 11 %
NEUTROS PCT: 50 %
Neutro Abs: 4.1 10*3/uL (ref 1.7–7.7)
PLATELETS: 323 10*3/uL (ref 150–400)
RBC: 4.24 MIL/uL (ref 3.87–5.11)
RDW: 13.2 % (ref 11.5–15.5)
WBC: 8.3 10*3/uL (ref 4.0–10.5)

## 2018-06-27 LAB — GLUCOSE, CAPILLARY
GLUCOSE-CAPILLARY: 111 mg/dL — AB (ref 70–99)
GLUCOSE-CAPILLARY: 143 mg/dL — AB (ref 70–99)
Glucose-Capillary: 95 mg/dL (ref 70–99)
Glucose-Capillary: 108 mg/dL — ABNORMAL HIGH (ref 70–99)

## 2018-06-27 LAB — BASIC METABOLIC PANEL
ANION GAP: 12 (ref 5–15)
BUN: 19 mg/dL (ref 8–23)
CALCIUM: 9.1 mg/dL (ref 8.9–10.3)
CO2: 32 mmol/L (ref 22–32)
CREATININE: 1.01 mg/dL — AB (ref 0.44–1.00)
Chloride: 100 mmol/L (ref 98–111)
GFR, EST NON AFRICAN AMERICAN: 57 mL/min — AB (ref >60)
Glucose, Bld: 106 mg/dL — ABNORMAL HIGH (ref 70–99)
Potassium: 3 mmol/L — ABNORMAL LOW (ref 3.5–5.1)
Sodium: 144 mmol/L (ref 135–145)

## 2018-06-27 LAB — MAGNESIUM: Magnesium: 1.6 mg/dL — ABNORMAL LOW (ref 1.7–2.4)

## 2018-06-27 MED ORDER — LISINOPRIL 5 MG PO TABS
5.0000 mg | ORAL_TABLET | Freq: Every day | ORAL | Status: DC
  Administered 2018-06-28: 5 mg via ORAL
  Filled 2018-06-27: qty 1

## 2018-06-27 MED ORDER — FUROSEMIDE 20 MG PO TABS
20.0000 mg | ORAL_TABLET | Freq: Every day | ORAL | Status: DC
  Administered 2018-06-28: 20 mg via ORAL
  Filled 2018-06-27: qty 1

## 2018-06-27 MED ORDER — LOSARTAN POTASSIUM 25 MG PO TABS: 25.0000 mg | ORAL_TABLET | Freq: Every day | ORAL | Status: DC

## 2018-06-27 MED ORDER — POTASSIUM CHLORIDE CRYS ER 20 MEQ PO TBCR
40.0000 meq | EXTENDED_RELEASE_TABLET | ORAL | Status: AC
  Administered 2018-06-27 (×2): 40 meq via ORAL
  Filled 2018-06-27: qty 2

## 2018-06-27 MED ORDER — MAGNESIUM SULFATE 2 GM/50ML IV SOLN
2.0000 g | Freq: Once | INTRAVENOUS | Status: AC
  Administered 2018-06-27: 2 g via INTRAVENOUS
  Filled 2018-06-27: qty 50

## 2018-06-27 NOTE — Consult Note (Signed)
Cardiology Consultation:   Patient ID: Brandy Bond; [id number]; Apr 10, 1951   Admit date: 06/26/2018 Date of Consult: 06/27/2018  Primary Care Provider: Hoy Register, MD Primary Cardiologist: New to Dr. Rennis Golden    Patient Profile:   Brandy Bond is a 67 y.o. female with a hx of hypertension, osteoarthritis, tobacco abuse, alcohol abuse and noncompliance who is being seen today for the evaluation of CHF at the request of Dr. Lowell Guitar.  No prior cardiac history.  Denies family history of CAD.  History of Present Illness:   Brandy Bond presented for evaluation of worsening shortness of breath, orthopnea and lower extremity edema.  Patient was seen by PCP 7/15 for similar issue and out of her antihypertensive for past 1 month.  Work-up revealed elevated BNP and started on Lasix however, she never picked up prescription.  She presented to ER yesterday with worsening of symptoms.  Vascular congestion with elevated BNP from previous readings.  She was admitted and started on IV diuresis.  She diuresed 9.6 L so far.  Lost 5 pound (110>>105lb).  She endorse eating high sodium diet.  Denies chest pain, palpitation, syncope or dizziness.  Breathing has improved significantly.  Now able to lay flat.  Echocardiogram abnormal as below and cardiology is asked for further evaluation. Study Conclusions  - Left ventricle: The cavity size was normal. Wall thickness was   increased in a pattern of moderate to severe LVH. Systolic   function was mildly to moderately reduced. The estimated ejection   fraction was in the range of 40% to 45%. Diffuse hypokinesis.   Features are consistent with a pseudonormal left ventricular   filling pattern, with concomitant abnormal relaxation and   increased filling pressure (grade 2 diastolic dysfunction). - Aortic valve: Valve area (VTI): 1.57 cm^2. Valve area (Vmean):   1.67 cm^2. - Mitral valve: There was mild regurgitation. - Left atrium: The atrium was severely  dilated. - Right atrium: The atrium was severely dilated. - Atrial septum: No defect or patent foramen ovale was identified. - Tricuspid valve: There was moderate regurgitation. - Pulmonary arteries: PA peak pressure: 40 mm Hg (S).  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 Recommend PYP scan or cardiac MRI based on   clinical suspicion.  Past Medical History:  Diagnosis Date  . Hypertension     Past Surgical History:  Procedure Laterality Date  . HAND SURGERY     Inpatient Medications: Scheduled Meds: . aspirin EC  81 mg Oral Daily  . atorvastatin  20 mg Oral q1800  . carvedilol  12.5 mg Oral BID WC  . enoxaparin (LOVENOX) injection  40 mg Subcutaneous Q24H  . feeding supplement (ENSURE ENLIVE)  237 mL Oral BID BM  . furosemide  40 mg Intravenous Q12H  . insulin aspart  0-9 Units Subcutaneous TID WC  . lisinopril  5 mg Oral Daily  . loratadine  10 mg Oral Daily  . potassium chloride  20 mEq Oral Daily  . sodium chloride flush  3 mL Intravenous Q12H   Continuous Infusions: . sodium chloride    . magnesium sulfate 1 - 4 g bolus IVPB     PRN Meds: sodium chloride, acetaminophen, hydrALAZINE, ondansetron (ZOFRAN) IV, sodium chloride flush, traMADol  Allergies:    Allergies  Allergen Reactions  . Penicillins Shortness Of Breath, Itching and Swelling    Has patient had a PCN reaction causing immediate rash, facial/tongue/throat swelling, SOB or lightheadedness with hypotension: yes Has patient  had a PCN reaction causing severe rash involving mucus membranes or skin necrosis: no Has patient had a PCN reaction that required hospitalization: unknown Has patient had a PCN reaction occurring within the last 10 years: no If all of the above answers are "NO", then may proceed with Cephalosporin use.     Social History:   Social History   Socioeconomic History  . Marital status: Single    Spouse name: Not on  file  . Number of children: Not on file  . Years of education: Not on file  . Highest education level: Not on file  Occupational History  . Occupation: retired  Engineer, production  . Financial resource strain: Not on file  . Food insecurity:    Worry: Not on file    Inability: Not on file  . Transportation needs:    Medical: Not on file    Non-medical: Not on file  Tobacco Use  . Smoking status: Former Smoker    Years: 5.00  . Smokeless tobacco: Never Used  . Tobacco comment: more than 10 years ago  Substance and Sexual Activity  . Alcohol use: Not Currently    Alcohol/week: 1.2 - 1.8 oz    Types: 2 - 3 Cans of beer per week    Comment: occasionally  . Drug use: No  . Sexual activity: Not Currently  Lifestyle  . Physical activity:    Days per week: Not on file    Minutes per session: Not on file  . Stress: Not on file  Relationships  . Social connections:    Talks on phone: Not on file    Gets together: Not on file    Attends religious service: Not on file    Active member of club or organization: Not on file    Attends meetings of clubs or organizations: Not on file    Relationship status: Not on file  . Intimate partner violence:    Fear of current or ex partner: Not on file    Emotionally abused: Not on file    Physically abused: Not on file    Forced sexual activity: Not on file  Other Topics Concern  . Not on file  Social History Narrative  . Not on file    Family History:   Family History  Problem Relation Age of Onset  . Heart failure Neg Hx   . CAD Neg Hx      ROS:  Please see the history of present illness.  All other ROS reviewed and negative.     Physical Exam/Data:   Vitals:   06/27/18 0328 06/27/18 0356 06/27/18 0829 06/27/18 1217  BP: (!) 158/100 (!) 160/89 (!) 127/91 103/63  Pulse: 72 74 72 72  Resp: 13 18 18 18   Temp: 99.1 F (37.3 C) 98.4 F (36.9 C) 99 F (37.2 C) 98.3 F (36.8 C)  TempSrc: Oral Oral Oral Oral  SpO2: 98% 98% 97% 99%   Weight:  105 lb 3.2 oz (47.7 kg)    Height:        Intake/Output Summary (Last 24 hours) at 06/27/2018 1316 Last data filed at 06/27/2018 1118 Gross per 24 hour  Intake 1110 ml  Output 6600 ml  Net -5490 ml   Filed Weights   06/26/18 1425 06/27/18 0356  Weight: 110 lb 12.8 oz (50.3 kg) 105 lb 3.2 oz (47.7 kg)   Body mass index is 20.55 kg/m.  General: Thin frail female in no acute distress who laying flat  on her bed HEENT: normal Lymph: no adenopathy Neck: + JVD Endocrine:  No thryomegaly Vascular: No carotid bruits; FA pulses 2+ bilaterally without bruits  Cardiac:  normal S1, S2; RRR; no murmur  Lungs:  clear to auscultation bilaterally, no wheezing, rhonchi or rales  Abd: soft, nontender, no hepatomegaly  Ext: Trace edema on right leg Musculoskeletal:  No deformities, effusion noted on left knee Skin: warm and dry  Neuro:  CNs 2-12 intact, no focal abnormalities noted Psych:  Normal affect   EKG:  The EKG was personally reviewed and demonstrates: Sinus rhythm at 68 bpm, LVH and prolonged QT Telemetry:  Telemetry was personally reviewed and demonstrates: Sinus rhythm with PAC  Relevant CV Studies: As above  Laboratory Data:  Chemistry Recent Labs  Lab 06/23/18 1024 06/26/18 0635 06/27/18 0529  NA 146* 145 144  K 3.1* 2.9* 3.0*  CL 103 108 100  CO2 27 28 32  GLUCOSE 86 142* 106*  BUN 11 16 19   CREATININE 0.87 1.00 1.01*  CALCIUM 8.8 8.3* 9.1  GFRNONAA 70 58* 57*  GFRAA 81 >60 >60  ANIONGAP  --  9 12    Recent Labs  Lab 06/23/18 1024 06/26/18 0635  PROT 5.5* 5.8*  ALBUMIN 3.4* 3.0*  AST 19 38  ALT 17 33  ALKPHOS 132* 156*  BILITOT 0.8 1.2   Hematology Recent Labs  Lab 06/26/18 0635 06/27/18 0529  WBC 12.1* 8.3  RBC 3.96 4.24  HGB 11.7* 12.4  HCT 37.0 38.7  MCV 93.4 91.3  MCH 29.5 29.2  MCHC 31.6 32.0  RDW 13.2 13.2  PLT 317 323   Cardiac Enzymes Recent Labs  Lab 06/26/18 1156 06/26/18 1706  TROPONINI 0.06* 0.04*    Recent Labs   Lab 06/26/18 0636  TROPIPOC 0.03    BNP Recent Labs  Lab 06/23/18 1024 06/26/18 0635  BNP 766.8* 917.7*    Radiology/Studies:  Dg Chest Port 1 View  Result Date: 06/26/2018 CLINICAL DATA:  Shortness of breath. EXAM: PORTABLE CHEST 1 VIEW COMPARISON:  12/14/2011. FINDINGS: Cardiomegaly with diffuse bilateral pulmonary infiltrates consistent with pulmonary edema. Small bilateral pleural effusions. These findings suggest CHF. No pneumothorax. No acute bony abnormality. Degenerative changes both shoulders. IMPRESSION: Congestive heart failure with bilateral pulmonary edema and small bilateral pleural effusions. Electronically Signed   By: Maisie Fus  Register   On: 06/26/2018 07:07    Assessment and Plan:   1.  Acute combined systolic and diastolic heart failure -BNP 766>>917.  Checks x-ray with vascular congestion.  She diuresed 9.6 later wiht 5 pound weight loss so far.  Still minimally volume overload.  Echocardiogram showed LV function of 40 to 45%, diffuse hypokinesis, grade 2 diastolic dysfunction. -Renal function stable.  Potassium improving with supplement.  I would continue IV diuresis at Lasix 40 mg twice daily, likely switch to p.o. Tomorrow. -Continue Coreg 12.5 mg twice daily and  lisinopril 5 mg daily.  She will benefit from additional Spironolactone (not on $4 list). -Compliance is a issue due to cost.  She is willing to consider changing lifestyle.  She also endorses eating high salt intake.  He will need heart failure education and $4 medications at discharge. -Likely alcohol versus hypertensive cardiomyopathy or both.  2.  Uncontrolled hypertension -Medication noncompliance.  Blood pressure has been improved on above medication.  3.  Tobacco abuse -Encourage cessation.  She is willing to quit.  4.  Alcohol abuse -Encourage cessation.  5.  Hypokalemia -Additional supplement given this morning 40 M  EQ x 2.  Continue daily K. Dur 20 M EQ.  Recheck labs in morning.  6.   Hypomagnesemia -We will give supplement  7.  Left knee effusion -Per primary team   For questions or updates, please contact [facility] Please consult [email or web address] for contact info under Cardiology/STEMI.   Lorelei Pont, PA  06/27/2018 1:16 PM

## 2018-06-27 NOTE — Progress Notes (Signed)
Initial Nutrition Assessment  DOCUMENTATION CODES:   Non-severe (moderate) malnutrition in context of chronic illness  INTERVENTION:    Continue Ensure Enlive po BID, each supplement provides 350 kcal and 20 grams of protein  CHF diet education provided  NUTRITION DIAGNOSIS:   Moderate Malnutrition related to chronic illness(CHF) as evidenced by mild fat depletion, moderate fat depletion, mild muscle depletion, moderate muscle depletion, percent weight loss(12.5% weight loss within 6 months).  GOAL:   Patient will meet greater than or equal to 90% of their needs  MONITOR:   PO intake, Supplement acceptance  REASON FOR ASSESSMENT:   Malnutrition Screening Tool    ASSESSMENT:   67 yo female with PMH of HTN who was admitted on 7/18 with new onset heart failure.  Spoke with patient and her brother-in-law. They report patient has been eating very well since admission, but has been eating poorly at home. She likes the Ensure supplements she has been receiving. She weighed 150 lbs 1 year ago. Per review of weight encounters, patient weighed 120 lbs 5 months ago. Now down to 105 lbs. 12.5% weight loss within the past 6 months is significant for the time frame.  RD provided "Heart Failure Nutrition Therapy for the Undernourished" handout from the Academy of Nutrition and Dietetics. Reviewed patient's dietary recall. Provided examples on ways to decrease sodium intake in diet. Discouraged intake of processed foods and use of salt shaker. Encouraged fresh fruits and vegetables as well as whole grain sources of carbohydrates to maximize fiber intake. Teach back method used. Expect fair-good compliance.  Labs reviewed. Potassium 3 (L), magnesium 1.6 (L) Both are being repleted IV.  Medications reviewed and include Lasix, KCl, and Magnesium sulfate.   NUTRITION - FOCUSED PHYSICAL EXAM:    Most Recent Value  Orbital Region  Mild depletion  Upper Arm Region  Moderate depletion   Thoracic and Lumbar Region  Moderate depletion  Buccal Region  Mild depletion  Temple Region  Mild depletion  Clavicle Bone Region  Moderate depletion  Clavicle and Acromion Bone Region  Severe depletion  Scapular Bone Region  Moderate depletion  Dorsal Hand  Mild depletion  Patellar Region  Mild depletion  Anterior Thigh Region  Mild depletion  Posterior Calf Region  Mild depletion  Edema (RD Assessment)  None  Hair  Reviewed  Eyes  Reviewed  Mouth  Reviewed  Skin  Reviewed  Nails  Reviewed       Diet Order:   Diet Order           Diet heart healthy/carb modified Room service appropriate? Yes; Fluid consistency: Thin  Diet effective now          EDUCATION NEEDS:   Education needs have been addressed  Skin:  Skin Assessment: Reviewed RN Assessment  Last BM:  7/18  Height:   Ht Readings from Last 1 Encounters:  06/26/18 5' (1.524 m)    Weight:   Wt Readings from Last 1 Encounters:  06/27/18 105 lb 3.2 oz (47.7 kg)    Ideal Body Weight:  45.5 kg  BMI:  Body mass index is 20.55 kg/m.  Estimated Nutritional Needs:   Kcal:  1400-1600  Protein:  70-80 gm  Fluid:  1.4-1.6 L    Joaquin Courts, RD, LDN, CNSC Pager [phone] After Hours Pager [phone]

## 2018-06-27 NOTE — Progress Notes (Signed)
PROGRESS NOTE    Brandy Bond  WGN:[id number] DOB: 21-Apr-1951 DOA: 06/26/2018 PCP: Hoy Register, MD   Brief Narrative:  Brandy Bond is Brandy Bond 67 y.o. female with medical history significant of HTN noncompliant with medications; and OA of the left knee presenting with CP and SOB.  "I couldn't breathe."  It started about 0400.  She had been sleeping and her hand was hurting and woke her up.  She became acutely SOB.  She had some chest tightness.  +LE edema.  ?orthopnea.  No PND.  She was given medications from her PCP and she did not pick them up.    She is already feeling better since being given Lasix in the ER and she has diuresed over Abdoulaye Drum liter.  She has never had an Echo.  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  Consider adding spironolactone as well at discharge.  All these meds should be 10$ or less based on goodrx. - troponin, mild and flat.  EKG with prolonged QT, LVH.   - cards c/s -> outpatient amyloid w/u (will discuss with cards tomorrow),   Prolonged QTc: replete mag/k and follow.  Avoid QT prolonging meds.  HTN - Patient has had difficulty affording medications - Will d/c with above medications - follow renin/aldo, follow TSH  Hyperglycemia - A1c 5  Hypokalemia  Hypomagnesemia: - replete - follow renin/aldo with htn as well  Hyperlipidemia -Lipids checked in 2/19: LDL 112, HDL 63 -Lipitor 20 mg daily  L Knee Effusion: likely 2/2 OA, follow  outpatient with ortho  Social - Pt has applied for citizenship, but not approved at this time.  Limited in our resources to help her in this situation per discussion with CM.  Will prescribe meds on 4 dollar list and encourage her to get meds from [facility] as well  DVT prophylaxis: lovenox Code Status: full  Family Communication: sister and brother in law Disposition Plan: possibly tomorrow  Consultants:   cardiology  Procedures:  Study Conclusions  - Left ventricle: The cavity size was normal. Wall thickness was   increased in Merrell Rettinger pattern of moderate to severe LVH. Systolic   function was mildly to moderately reduced. The estimated ejection   fraction was in the range of 40% to 45%. Diffuse hypokinesis.   Features are consistent with Gilberto Streck pseudonormal left ventricular   filling pattern, with concomitant abnormal relaxation and   increased filling pressure (grade 2 diastolic dysfunction). - Aortic valve: Valve area (VTI): 1.57 cm^2. Valve area (Vmean):   1.67 cm^2. - Mitral valve: There was mild regurgitation. - Left atrium: The atrium was severely dilated. - Right atrium: The atrium was severely dilated. - Atrial septum: No defect or patent foramen ovale was identified. - Tricuspid valve: There was moderate regurgitation. - Pulmonary arteries: PA peak pressure: 40 mm Hg (S).  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 Recommend PYP scan or cardiac MRI based on   clinical suspicion.  Antimicrobials:  Anti-infectives (From admission, onward)   None      Subjective: Discussed with brother in law and pt. Offered interpreter, but declined. Feeling better.  SOB and swelling is better.  Objective: Vitals:   06/27/18 0356 06/27/18 0829 06/27/18 1217 06/27/18 1612  BP: (!) 160/89 (!) 127/91 103/63 123/74  Pulse: 74 72 72 79  Resp: 18 18 18 18   Temp: 98.4 F (36.9 C) 99 F (37.2 C) 98.3 F (36.8 C) 98.1 F (36.7 C)    TempSrc: Oral Oral Oral Oral  SpO2: 98% 97% 99% 97%  Weight: 47.7 kg (105 lb 3.2 oz)     Height:        Intake/Output Summary (Last 24 hours) at 06/27/2018 1847 Last data filed at 06/27/2018 1835 Gross per 24 hour  Intake 1163 ml  Output 5900 ml  Net -4737 ml   Filed Weights   06/26/18 1425 06/27/18 0356  Weight: 50.3 kg (110 lb 12.8 oz) 47.7 kg (105 lb 3.2 oz)    Examination:  General exam: Appears calm and comfortable  Respiratory system: Clear to auscultation. Respiratory effort normal. Cardiovascular system: S1 & S2 heard, RRR.  Gastrointestinal system: Abdomen is nondistended, soft and nontender.  Central nervous system: Alert and oriented. No focal neurological deficits. Extremities: L knee edema.  Skin: No rashes, lesions or ulcers Psychiatry: Judgement and insight appear normal. Mood & affect appropriate.     Data Reviewed: I have personally reviewed following labs and imaging studies  CBC: Recent Labs  Lab 06/26/18 0635 06/27/18 0529  WBC 12.1* 8.3  NEUTROABS 7.7 4.1  HGB 11.7* 12.4  HCT 37.0 38.7  MCV 93.4 91.3  PLT 317 323   Basic Metabolic Panel: Recent Labs  Lab 06/23/18 1024 06/26/18 0635 06/27/18 0529 06/27/18 0740  NA 146* 145 144  --   K 3.1* 2.9* 3.0*  --   CL 103 108 100  --   CO2 27 28 32  --   GLUCOSE 86 142* 106*  --   BUN 11 16 19   --   CREATININE 0.87 1.00 1.01*  --   CALCIUM 8.8 8.3* 9.1  --   MG  --   --   --  1.6*   GFR: Estimated Creatinine Clearance: 39.9 mL/min (Lindsea Olivar) (by C-G formula based on SCr of 1.01 mg/dL (H)). Liver Function Tests: Recent Labs  Lab 06/23/18 1024 06/26/18 0635  AST 19 38  ALT 17 33  ALKPHOS 132* 156*  BILITOT 0.8 1.2  PROT 5.5* 5.8*  ALBUMIN 3.4* 3.0*   No results for input(s): LIPASE, AMYLASE in the last 168 hours. No results for input(s): AMMONIA in the last 168 hours. Coagulation Profile: No results for input(s): INR, PROTIME in the last 168 hours. Cardiac Enzymes: Recent Labs  Lab  06/26/18 1156 06/26/18 1706  TROPONINI 0.06* 0.04*   BNP (last 3 results) No results for input(s): PROBNP in the last 8760 hours. HbA1C: Recent Labs    06/26/18 1706  HGBA1C 5.0   CBG: Recent Labs  Lab 06/26/18 1829 06/26/18 2118 06/27/18 0751 06/27/18 1136 06/27/18 1615  GLUCAP 107* 75 95 111* 108*   Lipid Profile: No results for input(s): CHOL, HDL, LDLCALC, TRIG, CHOLHDL, LDLDIRECT in the last 72 hours. Thyroid Function Tests: No results for input(s): TSH, T4TOTAL, FREET4, T3FREE, THYROIDAB in the last 72 hours. Anemia Panel: No results for input(s): VITAMINB12, FOLATE, FERRITIN, TIBC, IRON, RETICCTPCT in the last 72 hours. Sepsis Labs: No results for input(s): PROCALCITON, LATICACIDVEN in the  last 168 hours.  No results found for this or any previous visit (from the past 240 hour(s)).       Radiology Studies: Dg Chest Port 1 View  Result Date: 06/26/2018 CLINICAL DATA:  Shortness of breath. EXAM: PORTABLE CHEST 1 VIEW COMPARISON:  12/14/2011. FINDINGS: Cardiomegaly with diffuse bilateral pulmonary infiltrates consistent with pulmonary edema. Small bilateral pleural effusions. These findings suggest CHF. No pneumothorax. No acute bony abnormality. Degenerative changes both shoulders. IMPRESSION: Congestive heart failure with bilateral pulmonary edema and small bilateral pleural effusions. Electronically Signed   By: Maisie Fus  Register   On: 06/26/2018 07:07        Scheduled Meds: . aspirin EC  81 mg Oral Daily  . atorvastatin  20 mg Oral q1800  . carvedilol  12.5 mg Oral BID WC  . enoxaparin (LOVENOX) injection  40 mg Subcutaneous Q24H  . feeding supplement (ENSURE ENLIVE)  237 mL Oral BID BM  . [START ON 06/28/2018] furosemide  20 mg Oral Daily  . insulin aspart  0-9 Units Subcutaneous TID WC  . lisinopril  5 mg Oral Daily  . loratadine  10 mg Oral Daily  . potassium chloride  20 mEq Oral Daily  . sodium chloride flush  3 mL Intravenous Q12H   Continuous  Infusions: . sodium chloride       LOS: 1 day    Time spent: over 30 min    Lacretia Nicks, MD [facility] Pager [phone]  If 7PM-7AM, please contact night-coverage [email or web address] Password [facility] 06/27/2018, 6:47 PM

## 2018-06-27 NOTE — Telephone Encounter (Signed)
Patient sister was called and informed of lab results.

## 2018-06-27 NOTE — Progress Notes (Signed)
Physical Therapy Treatment Patient Details Name: Brandy Bond MRN: [id number] DOB: [birth year] Today's Date: 06/27/2018    History of Present Illness Pt is a 67 y/o female admitted secondary to SOB, found to have new onset CHF. PMH including but not limited to HTN (medical non-compliance).    PT Comments    Pt impulsive with movements and has poor safety awareness and knowledge of DME use to aid in safe mobility. Pt supervision with bed mobility. Pt impulsive with movement jumping up before gait belt donned and had decreased stability, pt asked to sit back down and was min guard when utilizing RW to come to standing. Focus of session today was to work on Museum/gallery curator. Initialized ambulation with min guard and RW however pt with increased L knee pain and instability with gait had to return to room after ambulation of 100 feet requiring increasing assist. Once pt returned to bed ice pack placed on swollen L knee. PT continues to recommend HHPT, however pt is currently refusing.     Follow Up Recommendations  Home health PT;Supervision for mobility/OOB     Equipment Recommendations  Rolling walker with 5" wheels    Recommendations for Other Services       Precautions / Restrictions Precautions Precautions: Fall Restrictions Weight Bearing Restrictions: No    Mobility  Bed Mobility Overal bed mobility: Needs Assistance Bed Mobility: Supine to Sit;Sit to Supine     Supine to sit: Supervision Sit to supine: Supervision   General bed mobility comments: increased time and effort, supervision for safety  Transfers Overall transfer level: Needs assistance Equipment used: Rolling walker (2 wheeled);None Transfers: Sit to/from Stand Sit to Stand: Min assist;Min guard         General transfer comment: pt stood up from bed without AD before bed alarm turned off and needed support to steady herself, PT asked her to sit back down, RW placed in front of her and able to perform  sit>stand with min guard   Ambulation/Gait Ambulation/Gait assistance: Min assist Gait Distance (Feet): 200 Feet Assistive device: Rolling walker (2 wheeled) Gait Pattern/deviations: Step-to pattern;Step-through pattern;Decreased step length - right;Decreased step length - left;Decreased stride length;Decreased weight shift to left;Antalgic;Trunk flexed Gait velocity: decreased Gait velocity interpretation: <1.8 ft/sec, indicate of risk for recurrent falls General Gait Details: ambulated 100 feet with RW with increasingly antalgic gait secondary to L knee pain, 2x LoB requiring minA for steadying, returned to room with minA for steadying with L weightbearing, pt very impulsive movement, ice applied to L knee upon returning to bed   Stairs Stairs: (unable to attempt secondary to increased knee pain)               Balance Overall balance assessment: Needs assistance Sitting-balance support: Feet supported Sitting balance-Leahy Scale: Good     Standing balance support: During functional activity;No upper extremity supported Standing balance-Leahy Scale: Fair                              Cognition Arousal/Alertness: Awake/alert Behavior During Therapy: WFL for tasks assessed/performed Overall Cognitive Status: Within Functional Limits for tasks assessed                                           General Comments General comments (skin integrity, edema, etc.): VSS      Pertinent  Vitals/Pain Pain Assessment: 0-10 Pain Score: 7  Pain Location: L knee Pain Descriptors / Indicators: Sore;Discomfort;Aching Pain Intervention(s): Limited activity within patient's tolerance;Monitored during session;Repositioned;Ice applied           PT Goals (current goals can now be found in the care plan section) Acute Rehab PT Goals Patient Stated Goal: decrease pain in L knee PT Goal Formulation: With patient Time For Goal Achievement: 07/10/18 Potential to  Achieve Goals: Good    Frequency    Min 3X/week      PT Plan Current plan remains appropriate    Co-evaluation              AM-PAC PT "6 Clicks" Daily Activity  Outcome Measure  Difficulty turning over in bed (including adjusting bedclothes, sheets and blankets)?: None Difficulty moving from lying on back to sitting on the side of the bed? : None Difficulty sitting down on and standing up from a chair with arms (e.g., wheelchair, bedside commode, etc,.)?: Unable Help needed moving to and from a bed to chair (including a wheelchair)?: A Little Help needed walking in hospital room?: A Little Help needed climbing 3-5 steps with a railing? : A Little 6 Click Score: 18    End of Session Equipment Utilized During Treatment: Gait belt Activity Tolerance: Patient limited by pain Patient left: in bed;with call bell/phone within reach;with bed alarm set Nurse Communication: Mobility status PT Visit Diagnosis: Other abnormalities of gait and mobility (R26.89);Pain Pain - Right/Left: Left Pain - part of body: Knee     Time: 1620-1640 PT Time Calculation (min) (ACUTE ONLY): 20 min  Charges:                       G Codes:       Brandy Bond B. Beverely Risen PT, DPT Acute Rehabilitation  [phone] Pager [phone]     Elon Alas Fleet 06/27/2018, 4:59 PM

## 2018-06-27 NOTE — Care Management Note (Signed)
Case Management Note  Patient Details  Name: Brandy Bond MRN: [id number] Date of Birth: 11-May-1951  Subjective/Objective:   CHF                Action/Plan: Patient lives at home with friends; goes to the [facility] [facility] [employer] for medical care / pharmacy of choice is [facility]. Patient is from [place]. She has applied for citizenship but it has not been approved at this time. Financial Counselor in to see pt/ emergent Medicaid but not Medicaid ongoing due to lack of citizenship. She is requesting a cane at discharge and is refusing HHC. CM will continue to follow for progression of care.   Expected Discharge Date:    possibly 7/ 22/2019              Expected Discharge Plan:  Home w Home Health Services  In-House Referral:   Financial Counselor  Discharge planning Services  CM Consult   Choice offered to:  Patient    HH Arranged:  Patient Refused [facility]   Status of Service:  In process, will continue to follow  Reola Mosher [phone] 06/27/2018, 2:59 PM

## 2018-06-28 DIAGNOSIS — I5041 Acute combined systolic (congestive) and diastolic (congestive) heart failure: Secondary | ICD-10-CM

## 2018-06-28 LAB — GLUCOSE, CAPILLARY
GLUCOSE-CAPILLARY: 106 mg/dL — AB (ref 70–99)
Glucose-Capillary: 140 mg/dL — ABNORMAL HIGH (ref 70–99)

## 2018-06-28 LAB — BASIC METABOLIC PANEL
Anion gap: 7 (ref 5–15)
BUN: 28 mg/dL — ABNORMAL HIGH (ref 8–23)
CHLORIDE: 101 mmol/L (ref 98–111)
CO2: 36 mmol/L — AB (ref 22–32)
CREATININE: 1.19 mg/dL — AB (ref 0.44–1.00)
Calcium: 9.4 mg/dL (ref 8.9–10.3)
GFR calc non Af Amer: 47 mL/min — ABNORMAL LOW (ref >60)
GFR, EST AFRICAN AMERICAN: 54 mL/min — AB (ref >60)
Glucose, Bld: 104 mg/dL — ABNORMAL HIGH (ref 70–99)
Potassium: 3.8 mmol/L (ref 3.5–5.1)
Sodium: 144 mmol/L (ref 135–145)

## 2018-06-28 LAB — MAGNESIUM: MAGNESIUM: 2.1 mg/dL (ref 1.7–2.4)

## 2018-06-28 LAB — CBC
HEMATOCRIT: 42.2 % (ref 36.0–46.0)
HEMOGLOBIN: 13.3 g/dL (ref 12.0–15.0)
MCH: 29.3 pg (ref 26.0–34.0)
MCHC: 31.5 g/dL (ref 30.0–36.0)
MCV: 93 fL (ref 78.0–100.0)
Platelets: 360 10*3/uL (ref 150–400)
RBC: 4.54 MIL/uL (ref 3.87–5.11)
RDW: 13.2 % (ref 11.5–15.5)
WBC: 9.3 10*3/uL (ref 4.0–10.5)

## 2018-06-28 LAB — TSH: TSH: 1.739 u[IU]/mL (ref 0.350–4.500)

## 2018-06-28 MED ORDER — CARVEDILOL 12.5 MG PO TABS: 12.5000 mg | ORAL_TABLET | Freq: Two times a day (BID) | ORAL | 0 refills | Status: DC

## 2018-06-28 MED ORDER — LISINOPRIL 5 MG PO TABS: 5.0000 mg | ORAL_TABLET | Freq: Every day | ORAL | 0 refills | Status: DC

## 2018-06-28 MED ORDER — ASPIRIN 81 MG PO TBEC: 81.0000 mg | DELAYED_RELEASE_TABLET | Freq: Every day | ORAL | 0 refills | Status: AC

## 2018-06-28 MED ORDER — POTASSIUM CHLORIDE ER 20 MEQ PO TBCR
20.0000 meq | EXTENDED_RELEASE_TABLET | Freq: Every day | ORAL | 0 refills | Status: AC
Start: 2018-06-28 — End: 2018-10-21

## 2018-06-28 MED ORDER — FUROSEMIDE 20 MG PO TABS: 20.0000 mg | ORAL_TABLET | Freq: Every day | ORAL | 0 refills | Status: DC

## 2018-06-28 MED ORDER — ATORVASTATIN CALCIUM 20 MG PO TABS: 20.0000 mg | ORAL_TABLET | Freq: Every day | ORAL | 0 refills | Status: DC

## 2018-06-28 NOTE — Discharge Summary (Signed)
Physician Discharge Summary  Tanicka Bisaillon ZOX:[id number] DOB: 1951-08-21 DOA: 06/26/2018  PCP: Hoy Register, MD  Admit date: 06/26/2018 Discharge date: 06/28/2018  Time spent: 40 minutes  Recommendations for Outpatient Follow-up:  1. Follow up CBC/CMP 2. Ensure complaince with medications.  Consider adding spironolactone. 3. Follow renin/aldo level 4. Ensure follow up with cardiology for further w/u of HF 5. Follow up repeat EKG as outpatient for prolonged QTc 6. Follow up with ortho for L knee effusion   Discharge Diagnoses:  Principal Problem:   Congestive heart failure (CHF) (HCC) Active Problems:   Hypertension   Hypokalemia   Hyperglycemia   Hyperlipidemia   Malnutrition of moderate degree   Discharge Condition: stable  Diet recommendation: heart healthy  Filed Weights   06/26/18 1425 06/27/18 0356 06/28/18 0532  Weight: 50.3 kg (110 lb 12.8 oz) 47.7 kg (105 lb 3.2 oz) 46.4 kg (102 lb 6.4 oz)    History of present illness:  Brandy Bond Level 67 y.o.femalewith medical history significant ofHTN noncompliant with medications; and OA of the left knee presenting with CPand SOB."I couldn't breathe." It started about 0400. She had been sleeping and her hand was hurting and woke her up. She became acutely SOB. She had some chest tightness.+LE edema. ?orthopnea. No PND. She was given medications from her PCP and she did not pick them up. She is already feeling better since being given Lasix in the ER and she has diuresed over Tahliyah Anagnos liter. She has never had an Echo.  She was admitted for Stalin Gruenberg heart failure exacerbation.  She improved with diuresis.  Echo was notable for decreased EF with grade 2 diastolic dysfunction with findings concerning for amyloid.  Plan for outpatient follow up and work up with cardiology.    See below for additional details  Hospital Course:  New-onset CHF -Patient withminimalsmoking historyand withoutprior h/o respiratory failure  presenting with worsening SOB and LE edema - CXR consistent with pulmonary edema - Echo with EF 40-45%, grade 2 diastolic dysfunction -> findings concerning for amyloid - IV lasix, transitioned to PO lasix on day of discharge.  - Discharged with lasix, lisinopril, coreg (she and family note she'll be able to afford these medications), as well as aspirin, lipitor, and potassium.  Consider adding spironolactone based on blood pressure, cost, and compliance with other meds in follow up. - troponin, mild and flat.  EKG with prolonged QT, LVH.   - cards c/s -> outpatient amyloid w/u   Prolonged QTc: replete mag/k and follow.  Avoid QT prolonging meds.  Follow up repeat EKG as outpatient, avoid QT prolonging agents.  HTN - Patient has had difficulty affording medications - Will d/c with above medications for HF - follow renin/aldo (pending), follow TSH (wnl)  Hyperglycemia - A1c 5  Hypokalemia  Hypomagnesemia: - replete - d/c with supplemental K  Hyperlipidemia -Lipids checked in 2/19: LDL 112, HDL 63 -Lipitor 20 mg daily  L Knee Effusion: likely 2/2 OA, follow outpatient with ortho  Social - Pt has applied for citizenship, but not approved at this time.  Limited in our resources to help her in this situation per discussion with CM.  Discussed meds with her prior to discharge and prices based on goodrx, she and family note they'll be able to afford prescribed meds.  Procedures: Echo Study Conclusions  - Left ventricle: The cavity size was normal. Wall thickness was   increased in Otelia Hettinger pattern of moderate to severe LVH. Systolic   function was mildly to moderately reduced.  The estimated ejection   fraction was in the range of 40% to 45%. Diffuse hypokinesis.   Features are consistent with Arsenia Goracke pseudonormal left ventricular   filling pattern, with concomitant abnormal relaxation and   increased filling pressure (grade 2 diastolic dysfunction). - Aortic valve: Valve area (VTI):  1.57 cm^2. Valve area (Vmean):   1.67 cm^2. - Mitral valve: There was mild regurgitation. - Left atrium: The atrium was severely dilated. - Right atrium: The atrium was severely dilated. - Atrial septum: No defect or patent foramen ovale was identified. - Tricuspid valve: There was moderate regurgitation. - Pulmonary arteries: PA peak pressure: 40 mm Hg (S).  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 Recommend PYP scan or cardiac MRI based on   clinical suspicion.   Consultations:  cardiology  Discharge Exam: Vitals:   06/28/18 0946 06/28/18 1212  BP: 128/70 95/62  Pulse: 82 79  Resp: 20 20  Temp: 98.8 F (37.1 C) 97.7 F (36.5 C)  SpO2: 98% 100%   Feels better. Ready to go home Discussed with sister and brother in law at bedside.  Able to afford meds.  General: No acute distress. Cardiovascular: Heart sounds show Keosha Rossa regular rate, and rhythm. Lungs: Clear to auscultation bilaterally with good air movement.  Abdomen: Soft, nontender, nondistended Neurological: Alert and oriented 3. Moves all extremities 4 . Cranial nerves II through XII grossly intact. Skin: Warm and dry. No rashes or lesions. Extremities: No clubbing or cyanosis. No edema. L knee effusion. Psychiatric: Mood and affect are normal. Insight and judgment are appropraite.  Discharge Instructions   Discharge Instructions    (HEART FAILURE PATIENTS) Call MD:  Anytime you have any of the following symptoms: 1) 3 pound weight gain in 24 hours or 5 pounds in 1 week 2) shortness of breath, with or without Rody Keadle dry hacking cough 3) swelling in the hands, feet or stomach 4) if you have to sleep on extra pillows at night in order to breathe.   Complete by:  As directed    Call MD for:  difficulty breathing, headache or visual disturbances   Complete by:  As directed    Call MD for:  extreme fatigue   Complete by:  As directed    Call MD for:  persistant  dizziness or light-headedness   Complete by:  As directed    Call MD for:  persistant nausea and vomiting   Complete by:  As directed    Call MD for:  redness, tenderness, or signs of infection (pain, swelling, redness, odor or green/yellow discharge around incision site)   Complete by:  As directed    Call MD for:  severe uncontrolled pain   Complete by:  As directed    Call MD for:  temperature >100.4   Complete by:  As directed    Diet - low sodium heart healthy   Complete by:  As directed    Discharge instructions   Complete by:  As directed    You were seen for Bernadine Melecio heart failure exacerbation.  You improved with lasix and blood pressure medicines.  It's extremely important that you pick these medicines up today and take them daily.    You need to follow up with your PCP early next week.  You should also have follow up with cardiology.  They are going to do additional work up for something called amyloid.  You should have Dawn Convery repeat EKG when you follow up  with your PCP or cardiology to follow your QT interval.  Return for new, recurrent, or worsening symptoms.  Please ask your PCP to request records from this hospitalization so they know what was done and what the next steps will be.   Face-to-face encounter (required for Medicare/Medicaid patients)   Complete by:  As directed    I Lacretia Nicks certify that this patient is under my care and that I, or Manika Hast nurse practitioner or physician's assistant working with me, had Laymond Postle face-to-face encounter that meets the physician face-to-face encounter requirements with this patient on 06/28/2018. The encounter with the patient was in whole, or in part for the following medical condition(s) which is the primary reason for home health care (List medical condition): heart failure   The encounter with the patient was in whole, or in part, for the following medical condition, which is the primary reason for home health care:  heart failure   I certify  that, based on my findings, the following services are medically necessary home health services:   Nursing Physical therapy     Reason for Medically Necessary Home Health Services:  Skilled Nursing- Teaching of Disease Process/Symptom Management   My clinical findings support the need for the above services:  Shortness of breath with activity   Further, I certify that my clinical findings support that this patient is homebound due to:  Shortness of Breath with activity   For home use only DME Walker rolling   Complete by:  As directed    Patient needs Merica Prell walker to treat with the following condition:  Heart failure (HCC)   Heart Failure patients record your daily weight using the same scale at the same time of day   Complete by:  As directed    Home Health   Complete by:  As directed    To provide the following care/treatments:   PT RN     Increase activity slowly   Complete by:  As directed      Allergies as of 06/28/2018      Reactions   Penicillins Shortness Of Breath, Itching, Swelling   Has patient had Lavance Beazer PCN reaction causing immediate rash, facial/tongue/throat swelling, SOB or lightheadedness with hypotension: yes Has patient had Fatimah Sundquist PCN reaction causing severe rash involving mucus membranes or skin necrosis: no Has patient had Kysha Muralles PCN reaction that required hospitalization: unknown Has patient had Kenroy Timberman PCN reaction occurring within the last 10 years: no If all of the above answers are "NO", then may proceed with Cephalosporin use.      Medication List    STOP taking these medications   BAYER BACK & BODY PAIN EX ST 500-32.5 MG Tabs Generic drug:  Aspirin-Caffeine     TAKE these medications   aspirin 81 MG EC tablet Take 1 tablet (81 mg total) by mouth daily. Start taking on:  06/29/2018   atorvastatin 20 MG tablet Commonly known as:  LIPITOR Take 1 tablet (20 mg total) by mouth daily at 6 PM.   carvedilol 12.5 MG tablet Commonly known as:  COREG Take 1 tablet (12.5 mg  total) by mouth 2 (two) times daily with Kersten Salmons meal.   cetirizine 10 MG tablet Commonly known as:  ZYRTEC Take 1 tablet (10 mg total) by mouth daily. What changed:    when to take this  reasons to take this   furosemide 20 MG tablet Commonly known as:  LASIX Take 1 tablet (20 mg total) by mouth daily.  lisinopril 5 MG tablet Commonly known as:  PRINIVIL,ZESTRIL Take 1 tablet (5 mg total) by mouth daily. Start taking on:  06/29/2018   Potassium Chloride ER 20 MEQ Tbcr Take 20 mEq by mouth daily.   SALONPAS ARTHRITIS PAIN RELIEF EX Apply 1 patch topically daily as needed (pain).   traMADol 50 MG tablet Commonly known as:  ULTRAM Take 1 tablet (50 mg total) by mouth every 12 (twelve) hours as needed.            Durable Medical Equipment  (From admission, onward)        Start     Ordered   06/28/18 1220  For home use only DME Walker rolling  Once    Question:  Patient needs Santonio Speakman walker to treat with the following condition  Answer:  Weakness   06/28/18 1220   06/28/18 0000  For home use only DME Walker rolling    Question:  Patient needs Mikki Ziff walker to treat with the following condition  Answer:  Heart failure (HCC)   06/28/18 1226     Allergies  Allergen Reactions  . Penicillins Shortness Of Breath, Itching and Swelling    Has patient had Santos Hardwick PCN reaction causing immediate rash, facial/tongue/throat swelling, SOB or lightheadedness with hypotension: yes Has patient had Leisa Gault PCN reaction causing severe rash involving mucus membranes or skin necrosis: no Has patient had Makaylie Dedeaux PCN reaction that required hospitalization: unknown Has patient had Storey Stangeland PCN reaction occurring within the last 10 years: no If all of the above answers are "NO", then may proceed with Cephalosporin use.    Follow-up Information    Hoy Register, MD Follow up.   Specialty:  Family Medicine Why:  Please call Monday morning for follow up appointment Contact information: [address] [place]  [address] [phone]        Chrystie Nose, MD Follow up.   Specialty:  Cardiology Why:  You should get Jaysen Wey call within the next week for Willliam Pettet follow up appointment, if you don't hear from cardiology, please call  Contact information: [address] [place] [address] [phone]        Cammy Copa, MD Follow up.   Specialty:  Orthopedic Surgery Why:  Call for Kayler Buckholtz follow up appointment for your knee Contact information: [address] [place] [address] [phone]            The results of significant diagnostics from this hospitalization (including imaging, microbiology, ancillary and laboratory) are listed below for reference.    Significant Diagnostic Studies: Dg Chest Port 1 View  Result Date: 06/26/2018 CLINICAL DATA:  Shortness of breath. EXAM: PORTABLE CHEST 1 VIEW COMPARISON:  12/14/2011. FINDINGS: Cardiomegaly with diffuse bilateral pulmonary infiltrates consistent with pulmonary edema. Small bilateral pleural effusions. These findings suggest CHF. No pneumothorax. No acute bony abnormality. Degenerative changes both shoulders. IMPRESSION: Congestive heart failure with bilateral pulmonary edema and small bilateral pleural effusions. Electronically Signed   By: Maisie Fus  Register   On: 06/26/2018 07:07    Microbiology: No results found for this or any previous visit (from the past 240 hour(s)).   Labs: Basic Metabolic Panel: Recent Labs  Lab 06/23/18 1024 06/26/18 0635 06/27/18 0529 06/27/18 0740 06/28/18 0521  NA 146* 145 144  --  144  K 3.1* 2.9* 3.0*  --  3.8  CL 103 108 100  --  101  CO2 27 28 32  --  36*  GLUCOSE 86 142* 106*  --  104*  BUN 11 16 19   --  28*  CREATININE 0.87 1.00 1.01*  --  1.19*  CALCIUM 8.8 8.3* 9.1  --  9.4  MG  --   --   --  1.6* 2.1   Liver Function Tests: Recent Labs  Lab 06/23/18 1024 06/26/18 0635  AST 19 38  ALT 17 33  ALKPHOS 132* 156*  BILITOT 0.8 1.2  PROT 5.5* 5.8*  ALBUMIN  3.4* 3.0*   No results for input(s): LIPASE, AMYLASE in the last 168 hours. No results for input(s): AMMONIA in the last 168 hours. CBC: Recent Labs  Lab 06/26/18 0635 06/27/18 0529 06/28/18 0521  WBC 12.1* 8.3 9.3  NEUTROABS 7.7 4.1  --   HGB 11.7* 12.4 13.3  HCT 37.0 38.7 42.2  MCV 93.4 91.3 93.0  PLT 317 323 360   Cardiac Enzymes: Recent Labs  Lab 06/26/18 1156 06/26/18 1706  TROPONINI 0.06* 0.04*   BNP: BNP (last 3 results) Recent Labs    06/23/18 1024 06/26/18 0635  BNP 766.8* 917.7*    ProBNP (last 3 results) No results for input(s): PROBNP in the last 8760 hours.  CBG: Recent Labs  Lab 06/27/18 1136 06/27/18 1615 06/27/18 2116 06/28/18 0749 06/28/18 1210  GLUCAP 111* 108* 143* 106* 140*       Signed:  Lacretia Nicks MD.  [facility] 06/28/2018, 12:28 PM

## 2018-06-28 NOTE — Progress Notes (Signed)
Progress Note  Patient Name: Brandy Bond Date of Encounter: 06/28/2018  Primary Cardiologist: No primary care provider on file.   Subjective   Did well overnight. Slept comfortably. No issues with breathing. Understands plan for amyloid workup as outpatient.  Inpatient Medications    Scheduled Meds: . aspirin EC  81 mg Oral Daily  . atorvastatin  20 mg Oral q1800  . carvedilol  12.5 mg Oral BID WC  . enoxaparin (LOVENOX) injection  40 mg Subcutaneous Q24H  . feeding supplement (ENSURE ENLIVE)  237 mL Oral BID BM  . furosemide  20 mg Oral Daily  . insulin aspart  0-9 Units Subcutaneous TID WC  . lisinopril  5 mg Oral Daily  . loratadine  10 mg Oral Daily  . potassium chloride  20 mEq Oral Daily  . sodium chloride flush  3 mL Intravenous Q12H   Continuous Infusions: . sodium chloride     PRN Meds: sodium chloride, acetaminophen, hydrALAZINE, ondansetron (ZOFRAN) IV, sodium chloride flush, traMADol   Vital Signs    Vitals:   06/27/18 2353 06/28/18 0532 06/28/18 0717 06/28/18 0946  BP: 120/85 (!) 175/100 (!) 159/93 128/70  Pulse: 90 80 77 82  Resp: 16 18  20   Temp: 99.1 F (37.3 C) 99.2 F (37.3 C)  98.8 F (37.1 C)  TempSrc: Oral Oral  Oral  SpO2: 98% 96%  98%  Weight:  102 lb 6.4 oz (46.4 kg)    Height:        Intake/Output Summary (Last 24 hours) at 06/28/2018 1026 Last data filed at 06/28/2018 0500 Gross per 24 hour  Intake 680 ml  Output 3550 ml  Net -2870 ml   Filed Weights   06/26/18 1425 06/27/18 0356 06/28/18 0532  Weight: 110 lb 12.8 oz (50.3 kg) 105 lb 3.2 oz (47.7 kg) 102 lb 6.4 oz (46.4 kg)    Telemetry    SR- Personally Reviewed  ECG    SR with LVH - Personally Reviewed  Physical Exam   GEN: No acute distress.   Neck: supple, no JVD Cardiac: regular S1 and S2, no murmurs, rubs, or gallops.  Respiratory: Clear to auscultation bilaterally. GI: Soft, nontender, non-distended. Bowel sounds normal MS: Trace edema; No  deformity. Neuro:  Nonfocal, moves all limbs independently Psych: Normal affect   Labs    Chemistry Recent Labs  Lab 06/23/18 1024 06/26/18 0635 06/27/18 0529 06/28/18 0521  NA 146* 145 144 144  K 3.1* 2.9* 3.0* 3.8  CL 103 108 100 101  CO2 27 28 32 36*  GLUCOSE 86 142* 106* 104*  BUN 11 16 19  28*  CREATININE 0.87 1.00 1.01* 1.19*  CALCIUM 8.8 8.3* 9.1 9.4  PROT 5.5* 5.8*  --   --   ALBUMIN 3.4* 3.0*  --   --   AST 19 38  --   --   ALT 17 33  --   --   ALKPHOS 132* 156*  --   --   BILITOT 0.8 1.2  --   --   GFRNONAA 70 58* 57* 47*  GFRAA 81 >60 >60 54*  ANIONGAP  --  9 12 7      Hematology Recent Labs  Lab 06/26/18 0635 06/27/18 0529 06/28/18 0521  WBC 12.1* 8.3 9.3  RBC 3.96 4.24 4.54  HGB 11.7* 12.4 13.3  HCT 37.0 38.7 42.2  MCV 93.4 91.3 93.0  MCH 29.5 29.2 29.3  MCHC 31.6 32.0 31.5  RDW 13.2 13.2 13.2  PLT  317 323 360    Cardiac Enzymes Recent Labs  Lab 06/26/18 1156 06/26/18 1706  TROPONINI 0.06* 0.04*    Recent Labs  Lab 06/26/18 0636  TROPIPOC 0.03     BNP Recent Labs  Lab 06/23/18 1024 06/26/18 0635  BNP 766.8* 917.7*     DDimer No results for input(s): DDIMER in the last 168 hours.   Radiology    No results found.  Cardiac Studies   Echo 06/26/18: Left ventricle: The cavity size was normal. Wall thickness was   increased in a pattern of moderate to severe LVH. Systolic   function was mildly to moderately reduced. The estimated ejection   fraction was in the range of 40% to 45%. Diffuse hypokinesis.   Features are consistent with a pseudonormal left ventricular   filling pattern, with concomitant abnormal relaxation and   increased filling pressure (grade 2 diastolic dysfunction). - Aortic valve: Valve area (VTI): 1.57 cm^2. Valve area (Vmean):   1.67 cm^2. - Mitral valve: There was mild regurgitation. - Left atrium: The atrium was severely dilated. - Right atrium: The atrium was severely dilated. - Atrial septum: No  defect or patent foramen ovale was identified. - Tricuspid valve: There was moderate regurgitation. - Pulmonary arteries: PA peak pressure: 40 mm Hg (S).  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 Recommend PYP scan or cardiac MRI based on   clinical suspicion.  Patient Profile     67 y.o. female with acute combined systolic and diastolic heart failure. Now euvolemic. Plan for discharge with outpatient cardiology follow up.  Assessment & Plan    1. Acute combined systolic and diastolic heart failure -Plan for lasix with potassium supplementation, carvedilol, lisinopril. Will consider adding spironolactone at outpatient follow up (will discuss cost). -Compliance is a issue due to cost.  She is willing to consider changing lifestyle.  She also endorses eating high salt intake.  She will need heart failure education and $4 medications at discharge. -outpatient evaluation for possible amyloid vs hypertension as possible causes of her cardiomyopathy.  2.  Uncontrolled hypertension -Medication noncompliance.  Blood pressure has been improved on above medication.  3.  Tobacco abuse -Encourage cessation.  She is willing to quit.  4.  Alcohol abuse -Encourage cessation.  5.  Hypokalemia -discharge with PO supplementation.   Time Spent Directly with Patient: I have spent a total of 25 minutes with the patient reviewing hospital notes, telemetry, EKGs, labs and examining the patient as well as establishing an assessment and plan that was discussed personally with the patient.  > 50% of time was spent in direct patient care.  Length of Stay:  LOS: 2 days   Jodelle Red, MD, PhD [facility]   06/28/2018, 10:26 AM  [facility] will sign off in anticipation of discharge.   Medication Recommendations:  Carvedilol, lisinopril, and furosemide with potassium for heart failure; aspirin 81 mg and  atorvastatin. Other recommendations (labs, testing, etc): Recommend BMET next week. Follow up as an outpatient:  Dr. Rennis Golden  For questions or updates, please contact [facility] Please consult [email or web address] for contact info under Cardiology/STEMI.

## 2018-06-28 NOTE — Progress Notes (Signed)
Physical Therapy Treatment Patient Details Name: Brandy Bond MRN: [id number] DOB: 1951/02/21 Today's Date: 06/28/2018    History of Present Illness Pt is a 67 y/o female admitted secondary to SOB, found to have new onset CHF. PMH including but not limited to HTN (medical non-compliance).    PT Comments    Pt with steady progress with gait. Continues to have lt knee pain. Used regular rolling walker but needs youth walker due to short stature. Should do better with shorter walker.   Follow Up Recommendations  Home health PT;Supervision for mobility/OOB     Equipment Recommendations  Rolling walker with 5" wheels(youth size)    Recommendations for Other Services       Precautions / Restrictions Precautions Precautions: Fall Restrictions Weight Bearing Restrictions: No    Mobility  Bed Mobility Overal bed mobility: Modified Independent Bed Mobility: Supine to Sit;Sit to Supine     Supine to sit: Modified independent (Device/Increase time) Sit to supine: Modified independent (Device/Increase time)      Transfers Overall transfer level: Needs assistance Equipment used: Rolling walker (2 wheeled) Transfers: Sit to/from Stand Sit to Stand: Supervision         General transfer comment: supervision for safety  Ambulation/Gait Ambulation/Gait assistance: Supervision Gait Distance (Feet): 200 Feet Assistive device: Rolling walker (2 wheeled) Gait Pattern/deviations: Step-through pattern;Decreased step length - right;Decreased step length - left;Decreased stride length;Decreased weight shift to left;Antalgic;Trunk flexed Gait velocity: decreased Gait velocity interpretation: <1.31 ft/sec, indicative of household ambulator General Gait Details: Supervision for safety.    Stairs Stairs: Yes Stairs assistance: Min guard Stair Management: One rail Left;Step to pattern Number of Stairs: 1 General stair comments: Used portable step in room with use of counter to  simulate rail.   Wheelchair Mobility    Modified Rankin (Stroke Patients Only)       Balance Overall balance assessment: Needs assistance Sitting-balance support: Feet supported Sitting balance-Leahy Scale: Good     Standing balance support: During functional activity;No upper extremity supported Standing balance-Leahy Scale: Fair                              Cognition Arousal/Alertness: Awake/alert Behavior During Therapy: WFL for tasks assessed/performed Overall Cognitive Status: Within Functional Limits for tasks assessed                                        Exercises      General Comments        Pertinent Vitals/Pain Pain Assessment: Faces Faces Pain Scale: Hurts even more Pain Location: L knee Pain Descriptors / Indicators: Sore;Grimacing;Guarding Pain Intervention(s): Limited activity within patient's tolerance;Monitored during session    Home Living                      Prior Function            PT Goals (current goals can now be found in the care plan section) Progress towards PT goals: Progressing toward goals    Frequency    Min 3X/week      PT Plan Current plan remains appropriate    Co-evaluation              AM-PAC PT "6 Clicks" Daily Activity  Outcome Measure  Difficulty turning over in bed (including adjusting bedclothes, sheets and blankets)?: None Difficulty moving from  lying on back to sitting on the side of the bed? : None Difficulty sitting down on and standing up from a chair with arms (e.g., wheelchair, bedside commode, etc,.)?: A Little Help needed moving to and from a bed to chair (including a wheelchair)?: A Little Help needed walking in hospital room?: A Little Help needed climbing 3-5 steps with a railing? : A Little 6 Click Score: 20    End of Session Equipment Utilized During Treatment: Gait belt Activity Tolerance: Patient limited by pain Patient left: in bed;with  call bell/phone within reach Nurse Communication: Mobility status PT Visit Diagnosis: Other abnormalities of gait and mobility (R26.89);Pain Pain - Right/Left: Left Pain - part of body: Knee     Time: 1610-9604 PT Time Calculation (min) (ACUTE ONLY): 14 min  Charges:  $Gait Training: 8-22 mins                    G Codes:       [facility] PT [phone]    Angelina Ok [facility] 06/28/2018, 12:13 PM

## 2018-06-28 NOTE — Progress Notes (Signed)
Patient and family given discharge instructions and all questions answered.   

## 2018-06-28 NOTE — Care Management (Signed)
1222 06-28-18 Youth Agricultural consultant ordered via [facility]. Referral given to Reggie with [facility]. Pt still declines HH Services. No insurance at this time. No further needs from CM at this time. Gala Lewandowsky, RN,BSN Case Manager [phone]

## 2018-07-02 LAB — ALDOSTERONE + RENIN ACTIVITY W/ RATIO
ALDO / PRA Ratio: <4.1 (ref 0.0–30.0)
Aldosterone: <1 ng/dL (ref 0.0–30.0)
PRA LC/MS/MS: 0.241 ng/mL/hr (ref 0.167–5.380)

## 2018-07-04 ENCOUNTER — Ambulatory Visit (HOSPITAL_COMMUNITY): Admission: RE | Admit: 2018-07-04 | Payer: Self-pay | Source: Ambulatory Visit

## 2018-07-10 ENCOUNTER — Inpatient Hospital Stay: Payer: Self-pay

## 2018-07-10 NOTE — Progress Notes (Deleted)
Patient ID: Brandy Bond, female   DOB: May 22, 1951, 67 y.o.   MRN: [id number] Hospitalized 06/26/2018-06/28/2018 for acute HF.  History of present illness:  Brandy Kingis a 67 y.o.femalewith medical history significant ofHTN noncompliant with medications; and OA of the left knee presenting with CPand SOB."I couldn't breathe." It started about 0400. She had been sleeping and her hand was hurting and woke her up. She became acutely SOB. She had some chest tightness.+LE edema. ?orthopnea. No PND. She was given medications from her PCP and she did not pick them up. She is already feeling better since being given Lasix in the ER and she has diuresed over a liter. She has never had an Echo.  She was admitted for a heart failure exacerbation.  She improved with diuresis.  Echo was notable for decreased EF with grade 2 diastolic dysfunction with findings concerning for amyloid.  Plan for outpatient follow up and work up with cardiology.    See below for additional details  Hospital Course:  New-onset CHF -Patient withminimalsmoking historyand withoutprior h/o respiratory failure presenting with worsening SOB and LE edema - CXR consistent with pulmonary edema -Echo with EF 40-45%, grade 2 diastolic dysfunction -> findings concerning for amyloid -IV lasix, transitioned to PO lasix on day of discharge.  - Discharged with lasix, lisinopril, coreg (she and family note she'll be able to afford these medications), as well as aspirin, lipitor, and potassium. Consider adding spironolactone based on blood pressure, cost, and compliance with other meds in follow up. -troponin, mild and flat. EKG with prolonged QT, LVH.  - cards c/s -> outpatient amyloid w/u   Prolonged ERQ:SXQKSKS mag/k and follow. Avoid QT prolonging meds.  Follow up repeat EKG as outpatient, avoid QT prolonging agents.  HTN - Patient has had difficulty affording medications -Will d/c with above medications  for HF - follow renin/aldo (pending), follow TSH (wnl)  Hyperglycemia -A1c 5  Hypokalemia Hypomagnesemia: - replete - d/c with supplemental K  Hyperlipidemia -Lipids checked in 2/19:LDL 112, HDL 63 -Lipitor 20 mg daily  L Knee Effusion: likely 2/2 OA, follow outpatient with ortho  Social - Pt has applied for citizenship, but not approved at this time. Limited in our resources to help her in this situation per discussion with CM. Discussed meds with her prior to discharge and prices based on goodrx, she and family note they'll be able to afford prescribed meds.  Procedures: Echo Study Conclusions  - Left ventricle: The cavity size was normal. Wall thickness was increased in a pattern of moderate to severe LVH. Systolic function was mildly to moderately reduced. The estimated ejection fraction was in the range of 40% to 45%. Diffuse hypokinesis. Features are consistent with a pseudonormal left ventricular filling pattern, with concomitant abnormal relaxation and increased filling pressure (grade 2 diastolic dysfunction). - Aortic valve: Valve area (VTI): 1.57 cm^2. Valve area (Vmean): 1.67 cm^2. - Mitral valve: There was mild regurgitation. - Left atrium: The atrium was severely dilated. - Right atrium: The atrium was severely dilated. - Atrial septum: No defect or patent foramen ovale was identified. - Tricuspid valve: There was moderate regurgitation. - Pulmonary arteries: PA peak pressure: 40 mm Hg (S).  Impressions:  - Moderate-severe LV hypertrophy and severe biatrial enlargement, consider amyloid. Recommend PYP scan or cardiac MRI based on clinical suspicion.

## 2018-07-17 ENCOUNTER — Telehealth: Payer: Self-pay | Admitting: Family Medicine

## 2018-07-17 ENCOUNTER — Other Ambulatory Visit: Payer: Self-pay

## 2018-07-17 DIAGNOSIS — R600 Localized edema: Secondary | ICD-10-CM

## 2018-07-17 MED ORDER — FUROSEMIDE 20 MG PO TABS: 20.0000 mg | ORAL_TABLET | Freq: Every day | ORAL | 1 refills | Status: DC

## 2018-07-17 NOTE — Telephone Encounter (Signed)
Pt daughter came in to request a medication refill on -furosemide (LASIX) 20 MG tablet  Because they got their appointment times confused To [facility] [address] [place], [place] - [address] Please follow up until next office visit

## 2018-07-17 NOTE — Addendum Note (Signed)
Addended by: Lois Huxley, Jeannett Senior L on: 07/17/2018 12:31 PM   Modules accepted: Orders

## 2018-07-17 NOTE — Telephone Encounter (Signed)
Pt came in again to request a medication refill on -lisinopril (PRINIVIL,ZESTRIL) 5 MG tablet  She states pt misplaced her medications and needs refills until her next office visit. Please follow up. To [facility] [address] [place], [place] - [address]

## 2018-07-21 ENCOUNTER — Emergency Department (HOSPITAL_COMMUNITY)
Admission: EM | Admit: 2018-07-21 | Discharge: 2018-07-21 | Disposition: A | Discharge status: Home / Self Care | Payer: Self-pay | Attending: Emergency Medicine | Admitting: Emergency Medicine

## 2018-07-21 ENCOUNTER — Encounter (HOSPITAL_COMMUNITY): Payer: Self-pay | Admitting: Emergency Medicine

## 2018-07-21 ENCOUNTER — Other Ambulatory Visit: Payer: Self-pay

## 2018-07-21 ENCOUNTER — Emergency Department (HOSPITAL_COMMUNITY): Payer: Self-pay

## 2018-07-21 DIAGNOSIS — Z7982 Long term (current) use of aspirin: Secondary | ICD-10-CM | POA: Insufficient documentation

## 2018-07-21 DIAGNOSIS — Z87891 Personal history of nicotine dependence: Secondary | ICD-10-CM | POA: Insufficient documentation

## 2018-07-21 DIAGNOSIS — R410 Disorientation, unspecified: Secondary | ICD-10-CM

## 2018-07-21 DIAGNOSIS — I110 Hypertensive heart disease with heart failure: Secondary | ICD-10-CM | POA: Insufficient documentation

## 2018-07-21 DIAGNOSIS — I509 Heart failure, unspecified: Secondary | ICD-10-CM | POA: Insufficient documentation

## 2018-07-21 DIAGNOSIS — N390 Urinary tract infection, site not specified: Secondary | ICD-10-CM

## 2018-07-21 LAB — URINALYSIS, ROUTINE W REFLEX MICROSCOPIC
Bilirubin Urine: NEGATIVE
GLUCOSE, UA: NEGATIVE mg/dL
HGB URINE DIPSTICK: NEGATIVE
Ketones, ur: NEGATIVE mg/dL
NITRITE: NEGATIVE
PH: 6 (ref 5.0–8.0)
Protein, ur: NEGATIVE mg/dL
SPECIFIC GRAVITY, URINE: 1.011 (ref 1.005–1.030)

## 2018-07-21 LAB — COMPREHENSIVE METABOLIC PANEL
ALK PHOS: 103 U/L (ref 38–126)
ALT: 17 U/L (ref 0–44)
AST: 21 U/L (ref 15–41)
Albumin: 3.2 g/dL — ABNORMAL LOW (ref 3.5–5.0)
Anion gap: 7 (ref 5–15)
BUN: 17 mg/dL (ref 8–23)
CALCIUM: 9.3 mg/dL (ref 8.9–10.3)
CO2: 32 mmol/L (ref 22–32)
Chloride: 102 mmol/L (ref 98–111)
Creatinine, Ser: 1.09 mg/dL — ABNORMAL HIGH (ref 0.44–1.00)
GFR calc Af Amer: >60 mL/min (ref >60)
GFR, EST NON AFRICAN AMERICAN: 52 mL/min — AB (ref >60)
Glucose, Bld: 100 mg/dL — ABNORMAL HIGH (ref 70–99)
Potassium: 4.1 mmol/L (ref 3.5–5.1)
Sodium: 141 mmol/L (ref 135–145)
Total Bilirubin: 1.2 mg/dL (ref 0.3–1.2)
Total Protein: 6.8 g/dL (ref 6.5–8.1)

## 2018-07-21 LAB — CBC
HCT: 38.5 % (ref 36.0–46.0)
Hemoglobin: 12 g/dL (ref 12.0–15.0)
MCH: 28.8 pg (ref 26.0–34.0)
MCHC: 31.2 g/dL (ref 30.0–36.0)
MCV: 92.5 fL (ref 78.0–100.0)
Platelets: 264 10*3/uL (ref 150–400)
RBC: 4.16 MIL/uL (ref 3.87–5.11)
RDW: 13 % (ref 11.5–15.5)
WBC: 8.2 10*3/uL (ref 4.0–10.5)

## 2018-07-21 LAB — CBG MONITORING, ED: GLUCOSE-CAPILLARY: 138 mg/dL — AB (ref 70–99)

## 2018-07-21 MED ORDER — SULFAMETHOXAZOLE-TRIMETHOPRIM 800-160 MG PO TABS: 1.0000 | ORAL_TABLET | Freq: Two times a day (BID) | ORAL | 0 refills | Status: AC

## 2018-07-21 NOTE — ED Notes (Signed)
D/reviewed with patient and family 

## 2018-07-21 NOTE — Progress Notes (Signed)
Pt meets inpatient criteria per Fransisca Kaufmann, NP. Referral information has been sent to the following hospitals for review: [facility]   [facility]  [facility]  [facility]  [facility]  [facility]  [facility]   CSW will continue to assist with placement options.   Wells Guiles, LCSW, LCAS Disposition CSW [facility] BHH/TTS [phone]

## 2018-07-21 NOTE — ED Notes (Signed)
Patient transported to CT 

## 2018-07-21 NOTE — ED Provider Notes (Signed)
Urine is equivocal with 6-10 white cells and moderate leukocytes but given new sx and no prior hx of psych issues also speaking with psych they felt it reasonable to try a course of antibiotics and if symptoms continue then follow up with outpt resources.  Findings discussed with pt and her family will treat with abx.  Urine cultured   Gwyneth Sprout, MD 07/21/18 1857

## 2018-07-21 NOTE — Discharge Instructions (Signed)
[facility]  Offers substance abuse intensive outpatient program (several hours a day, several days a week), partial hospitalization program [phone] [address] [place], [place] [address]  [phone] [address] [place], [place] [address]  [phone] [address] [place], [place] [address]  [phone], [address] [place], [place] [address]  [facility] Individual counseling only Accepts private insurance only [phone] [address], [address] [place], [place] [address]

## 2018-07-21 NOTE — BHH Counselor (Signed)
Case staffed and reviewed with Fransisca Kaufmann, NP, who suggests patient be tested for a UTI. TTS writer and Fransisca Kaufmann, NP, telephone Al Decant, RN, informing her of the request for a urine screen. Kehinde, RN, report she would have Dr. Hyacinth Meeker put in the order.   Fransisca Kaufmann, NP, report disposition pending urine screen to rule out UTI.

## 2018-07-21 NOTE — ED Notes (Addendum)
This moment patient is A&O X 4, speech is clear. Per family member, patient is back to baseline

## 2018-07-21 NOTE — BHH Counselor (Signed)
Patient doctor called TTS expressing the patient urine screen was not positive for a UTI. Report she will culture the urine but does not think the patient white cell count is enough to express she has a UTI. Report she wants to clear patient for discharge.Marland Kitchen

## 2018-07-21 NOTE — ED Triage Notes (Signed)
Patient brought in by family for altered mental status since she left the hospital August 8. Patient frequently confused and acting erratic per family. For example, family states they found the patient rubbing blood on her face stating it was lipstick. Patient wandering alone out of house and being confused about where she goes. Patient alert at this time.

## 2018-07-21 NOTE — ED Notes (Addendum)
Family reports that patient hallucinates sometimes, they state that it may be related to depression because she and her boyfriend broke up, and her children are not in her life.

## 2018-07-21 NOTE — BH Assessment (Signed)
Tele Assessment Note   Patient Name: Brandy Bond MRN: [id number] Referring Physician:  Eber Hong, MD Location of Patient:  [facility] Location of Provider: Behavioral Health TTS Department  Kellye Samp is an 67 y.o. female presented for altered mental status by members of her family. Patient is a poor historian, information gathered from patient's brother-in-law Harvel Ricks. Per family patient has been thinking people are outside of her door, attempting to break into her home and are watching her. Patient's brother-in-law reports the paranoia has been going on for the past couple weeks. He denies patient expressing suicidal / homicidal ideation. Report patient lives alone therefore he does not know how her sleep or appetite but he thinks it's poor. Patient then report to TTS writer, "I be seeing people outside my door and they be knocking trying to get in."   Per Edward Qualia, MD, note The patient is a 67 year old female, she was recently admitted to the hospital with what appeared to be shortness of breath and congestive heart failure, echocardiogram performed on June 26, 2018 showed an ejection fraction of 40 to 45%, grade 2 diastolic dysfunction with diffuse hypokinesis.  She was found to have hypertension, hypokalemia, hyperlipidemia and had some malnutrition as well.  She was known to be noncompliant with her medications prior to that admission to the hospital.  This was found to be new onset congestive heart failure.  She was transitioned onto oral Lasix on the day of discharge, she was also discharged with prescriptions for lisinopril, Coreg, aspirin, Lipitor and oral potassium.  She was to follow-up with cardiologist as an outpatient and to have an amyloidosis work-up.  According to the family members who present with the patient she was doing fine from July 20 all the way until 2 days ago on August 10 when she started to have some altered mental status.  The family reports that she started  to become paranoid because people were outside of the house and she thought they were going to break in.  These were just children playing outside, she took the remote control for the television and hit it inside the close hamper, she became confused to her surroundings and got on the bus, she became lost in the city and had to call her family member to come pick her up, she has continued to say paranoid things to her sister who corroborates the stories.  The sister however states that she is at her baseline right now and does not seem to be confused or paranoid.  There is no history of depression, anxiety, schizophrenia, she does have a history of substance abuse with marijuana but has stopped using this, she no longer drinks any alcohol since her admission to the hospital 3 weeks ago.  The patient denies fevers vomiting abdominal pain chest pain coughing shortness of breath or swelling.  Disposition: Fransisca Kaufmann, NP, disposition pending urine screen to rule out UTI.     Diagnosis:  F23   Brief psychotic disorder  Past Medical History:  Past Medical History:  Diagnosis Date  . Hypertension     Past Surgical History:  Procedure Laterality Date  . HAND SURGERY      Family History:  Family History  Problem Relation Age of Onset  . Heart failure Neg Hx   . CAD Neg Hx     Social History:  reports that she has quit smoking. She quit after 5.00 years of use. She has never used smokeless tobacco. She reports that she drank  about 2.0 - 3.0 standard drinks of alcohol per week. She reports that she does not use drugs.  Additional Social History:  Alcohol / Drug Use Pain Medications: see MAR Prescriptions: see MAR Over the Counter: see MAR History of alcohol / drug use?: No history of alcohol / drug abuse Longest period of sobriety (when/how long): N/A   CIWA: CIWA-Ar BP: (!) 156/76 Pulse Rate: (!) 58 COWS:    Allergies:  Allergies  Allergen Reactions  . Penicillins Shortness Of  Breath, Itching and Swelling    Has patient had a PCN reaction causing immediate rash, facial/tongue/throat swelling, SOB or lightheadedness with hypotension: yes Has patient had a PCN reaction causing severe rash involving mucus membranes or skin necrosis: no Has patient had a PCN reaction that required hospitalization: unknown Has patient had a PCN reaction occurring within the last 10 years: no If all of the above answers are "NO", then may proceed with Cephalosporin use.     Home Medications:  (Not in a hospital admission)  OB/GYN Status:  No LMP recorded. Patient is postmenopausal.  General Assessment Data Location of Assessment: [facility] ED TTS Assessment: In system Is this a Tele or Face-to-Face Assessment?: Tele Assessment Is this an Initial Assessment or a Re-assessment for this encounter?: Initial Assessment Marital status: Single Is patient pregnant?: No Pregnancy Status: No Living Arrangements: Alone Can pt return to current living arrangement?: Yes Admission Status: Voluntary Is patient capable of signing voluntary admission?: Yes Referral Source: Self/Family/Friend Insurance type: Medicaid Potential      Crisis Care Plan Living Arrangements: Alone Legal Guardian: Other:(self ) Name of Psychiatrist: none report  Name of Therapist: none report     Risk to self with the past 6 months Suicidal Ideation: No Has patient been a risk to self within the past 6 months prior to admission? : No Suicidal Intent: No Has patient had any suicidal intent within the past 6 months prior to admission? : No Is patient at risk for suicide?: No Suicidal Plan?: No Has patient had any suicidal plan within the past 6 months prior to admission? : No Access to Means: No What has been your use of drugs/alcohol within the last 12 months?: none report  Previous Attempts/Gestures: No How many times?: 0 Other Self Harm Risks: none report Triggers for Past Attempts: None known Intentional  Self Injurious Behavior: None Family Suicide History: No Recent stressful life event(s): Other (Comment)(strained relationships with 2 daughters ) Persecutory voices/beliefs?: No Depression: No Substance abuse history and/or treatment for substance abuse?: No Suicide prevention information given to non-admitted patients: Not applicable  Risk to Others within the past 6 months Homicidal Ideation: No Does patient have any lifetime risk of violence toward others beyond the six months prior to admission? : No Thoughts of Harm to Others: No Current Homicidal Intent: No Current Homicidal Plan: No Access to Homicidal Means: No Identified Victim: n/a History of harm to others?: No Assessment of Violence: None Noted Violent Behavior Description: none noted Does patient have access to weapons?: No Criminal Charges Pending?: No Does patient have a court date: No Is patient on probation?: No  Psychosis Hallucinations: Auditory, Visual Delusions: None noted  Mental Status Report Appearance/Hygiene: In scrubs Eye Contact: Good Motor Activity: Freedom of movement Speech: Logical/coherent, Other (Comment)(hard to understand due to ascent ) Level of Consciousness: Alert Mood: Pleasant Affect: Other (Comment)(pleasant) Anxiety Level: None Thought Processes: Tangential Judgement: Impaired Orientation: Unable to assess Obsessive Compulsive Thoughts/Behaviors: None  Cognitive Functioning Concentration: Unable to  Assess Memory: Unable to Assess Is patient IDD: No Is patient DD?: No Insight: Poor Impulse Control: Unable to Assess Appetite: Poor Have you had any weight changes? : (UTA) Sleep: Unable to Assess Total Hours of Sleep: (UTA) Vegetative Symptoms: Unable to Assess  ADLScreening [facility] Assessment Services) Patient's cognitive ability adequate to safely complete daily activities?: No Patient able to express need for assistance with ADLs?: Yes Independently performs ADLs?: Yes  (appropriate for developmental age)  Prior Inpatient Therapy Prior Inpatient Therapy: No  Prior Outpatient Therapy Prior Outpatient Therapy: No Does patient have an ACCT team?: No Does patient have Intensive In-House Services?  : No Does patient have [facility] services? : No Does patient have P4CC services?: No  ADL Screening (condition at time of admission) Patient's cognitive ability adequate to safely complete daily activities?: No Patient able to express need for assistance with ADLs?: Yes Independently performs ADLs?: Yes (appropriate for developmental age)       Abuse/Neglect Assessment (Assessment to be complete while patient is alone) Abuse/Neglect Assessment Can Be Completed: Yes Physical Abuse: Denies Verbal Abuse: Denies Sexual Abuse: Denies Exploitation of patient/patient's resources: Denies Self-Neglect: Denies     Merchant navy officer (For Healthcare) Does Patient Have a Medical Advance Directive?: No Would patient like information on creating a medical advance directive?: No - Patient declined          Disposition:  Disposition Initial Assessment Completed for this Encounter: Maryan Char, NP, Disposition pending )  This service was provided via telemedicine using a 2-way, interactive audio and video technology.  Names of all persons participating in this telemedicine service and their role in this encounter. Name:  Harvel Ricks Role: Hillery [facility] 07/21/2018 2:59 PM

## 2018-07-21 NOTE — ED Provider Notes (Signed)
[facility] EMERGENCY DEPARTMENT Provider Note   CSN: [id number] Arrival date & time: 07/21/18  1053     History   Chief Complaint Chief Complaint  Patient presents with  . Altered Mental Status    HPI Brandy Bond is a 67 y.o. female.  HPI  The patient is a 67 year old female, she was recently admitted to the hospital with what appeared to be shortness of breath and congestive heart failure, echocardiogram performed on June 26, 2018 showed an ejection fraction of 40 to 45%, grade 2 diastolic dysfunction with diffuse hypokinesis.  She was found to have hypertension, hypokalemia, hyperlipidemia and had some malnutrition as well.  She was known to be noncompliant with her medications prior to that admission to the hospital.  This was found to be new onset congestive heart failure.  She was transitioned onto oral Lasix on the day of discharge, she was also discharged with prescriptions for lisinopril, Coreg, aspirin, Lipitor and oral potassium.  She was to follow-up with cardiologist as an outpatient and to have an amyloidosis work-up.  According to the family members who present with the patient she was doing fine from July 20 all the way until 2 days ago on August 10 when she started to have some altered mental status.  The family reports that she started to become paranoid because people were outside of the house and she thought they were going to break in.  These were just children playing outside, she took the remote control for the television and hit it inside the close hamper, she became confused to her surroundings and got on the bus, she became lost in the city and had to call her family member to come pick her up, she has continued to say paranoid things to her sister who corroborates the stories.  The sister however states that she is at her baseline right now and does not seem to be confused or paranoid.  There is no history of depression, anxiety, schizophrenia, she  does have a history of substance abuse with marijuana but has stopped using this, she no longer drinks any alcohol since her admission to the hospital 3 weeks ago.  The patient denies fevers vomiting abdominal pain chest pain coughing shortness of breath or swelling.  The family also notes that she has not been taking her medications, thus they have been going over the house to make her take them in the morning and at night.  Past Medical History:  Diagnosis Date  . Hypertension     Patient Active Problem List   Diagnosis Date Noted  . Malnutrition of moderate degree 06/27/2018  . Congestive heart failure (CHF) (HCC) 06/26/2018  . Hypokalemia 06/26/2018  . Hyperglycemia 06/26/2018  . Hyperlipidemia 06/26/2018  . Osteoarthritis of left knee 10/11/2016  . Hypertension 09/18/2016  . CONSTIPATION, CHRONIC 10/11/2010    Past Surgical History:  Procedure Laterality Date  . HAND SURGERY       OB History   None      Home Medications    Prior to Admission medications   Medication Sig Start Date End Date Taking? Authorizing Provider  aspirin EC 81 MG EC tablet Take 1 tablet (81 mg total) by mouth daily. 06/29/18 07/29/18 Yes Zigmund Daniel., MD  atorvastatin (LIPITOR) 20 MG tablet Take 1 tablet (20 mg total) by mouth daily at 6 PM. 06/28/18 07/28/18 Yes Zigmund Daniel., MD  carvedilol (COREG) 12.5 MG tablet Take 1 tablet (12.5 mg total)  by mouth 2 (two) times daily with a meal. 06/28/18 07/28/18 Yes Zigmund Daniel., MD  cetirizine (ZYRTEC) 10 MG tablet Take 1 tablet (10 mg total) by mouth daily. Patient taking differently: Take 10 mg by mouth daily as needed for allergies.  01/31/18  Yes Hoy Register, MD  furosemide (LASIX) 20 MG tablet Take 1 tablet (20 mg total) by mouth daily. 07/17/18  Yes Hoy Register, MD  lisinopril (PRINIVIL,ZESTRIL) 5 MG tablet Take 1 tablet (5 mg total) by mouth daily. 06/29/18 07/29/18 Yes Zigmund Daniel., MD  Potassium Chloride ER 20  MEQ TBCR Take 20 mEq by mouth daily. 06/28/18 07/28/18 Yes Zigmund Daniel., MD  traMADol (ULTRAM) 50 MG tablet Take 1 tablet (50 mg total) by mouth every 12 (twelve) hours as needed. Patient taking differently: Take 50 mg by mouth every 12 (twelve) hours as needed for moderate pain.  06/23/18  Yes Hoy Register, MD  sulfamethoxazole-trimethoprim (BACTRIM DS,SEPTRA DS) 800-160 MG tablet Take 1 tablet by mouth 2 (two) times daily for 7 days. 07/21/18 07/28/18  Gwyneth Sprout, MD    Family History Family History  Problem Relation Age of Onset  . Heart failure Neg Hx   . CAD Neg Hx     Social History Social History   Tobacco Use  . Smoking status: Former Smoker    Years: 5.00  . Smokeless tobacco: Never Used  . Tobacco comment: more than 10 years ago  Substance Use Topics  . Alcohol use: Not Currently    Alcohol/week: 2.0 - 3.0 standard drinks    Types: 2 - 3 Cans of beer per week    Comment: occasionally  . Drug use: No     Allergies   Penicillins   Review of Systems Review of Systems  All other systems reviewed and are negative.    Physical Exam Updated Vital Signs BP (!) 177/75   Pulse (!) 57   Temp 98.5 F (36.9 C)   Resp 18   SpO2 93%   Physical Exam  Constitutional: She appears well-developed and well-nourished. No distress.  HENT:  Head: Normocephalic and atraumatic.  Mouth/Throat: Oropharynx is clear and moist. No oropharyngeal exudate.  Eyes: Pupils are equal, round, and reactive to light. Conjunctivae and EOM are normal. Right eye exhibits no discharge. Left eye exhibits no discharge. No scleral icterus.  Neck: Normal range of motion. Neck supple. No JVD present. No thyromegaly present.  Cardiovascular: Normal rate, regular rhythm, normal heart sounds and intact distal pulses. Exam reveals no gallop and no friction rub.  No murmur heard. Pulmonary/Chest: Effort normal and breath sounds normal. No respiratory distress. She has no wheezes. She has  no rales.  Abdominal: Soft. Bowel sounds are normal. She exhibits no distension and no mass. There is no tenderness.  Musculoskeletal: Normal range of motion. She exhibits no edema or tenderness.  Lymphadenopathy:    She has no cervical adenopathy.  Neurological: She is alert. Coordination normal.  Speech is clear, coordination's are exact by finger-nose-finger, there is no pronator drift and normal strength in all 4 extremities.  No facial droop  Skin: Skin is warm and dry. No rash noted. No erythema.  Psychiatric: She has a normal mood and affect. Her behavior is normal.  Nursing note and vitals reviewed.    ED Treatments / Results  Labs (all labs ordered are listed, but only abnormal results are displayed) Labs Reviewed  COMPREHENSIVE METABOLIC PANEL - Abnormal; Notable for the following components:  Result Value   Glucose, Bld 100 (*)    Creatinine, Ser 1.09 (*)    Albumin 3.2 (*)    GFR calc non Af Amer 52 (*)    All other components within normal limits  URINALYSIS, ROUTINE W REFLEX MICROSCOPIC - Abnormal; Notable for the following components:   APPearance HAZY (*)    Leukocytes, UA MODERATE (*)    Bacteria, UA FEW (*)    All other components within normal limits  CBG MONITORING, ED - Abnormal; Notable for the following components:   Glucose-Capillary 138 (*)    All other components within normal limits  URINE CULTURE  CBC  CBG MONITORING, ED    EKG None  Radiology Ct Head Wo Contrast  Result Date: 07/21/2018 CLINICAL DATA:  Initial evaluation for acute altered mental status. EXAM: CT HEAD WITHOUT CONTRAST TECHNIQUE: Contiguous axial images were obtained from the base of the skull through the vertex without intravenous contrast. COMPARISON:  Prior CT from 12/14/2011. FINDINGS: Brain: Generalized age-related cerebral atrophy with mild chronic small vessel ischemic disease. Small remote left frontoparietal infarct noted. No acute intracranial hemorrhage. No acute  large vessel territory infarct. No mass lesion, midline shift or mass effect. No hydrocephalus. No extra-axial fluid collection. Vascular: No hyperdense vessel. Scattered vascular calcifications noted within the carotid siphons. Skull: Scalp soft tissues and calvarium within normal limits. Sinuses/Orbits: Globes and orbital soft tissues normal. Paranasal sinuses and mastoid air cells are clear. Other: None. IMPRESSION: 1. No acute intracranial abnormality. 2. Small remote left frontoparietal cortical infarct. 3. Age-related cerebral atrophy with mild chronic small vessel ischemic disease. Electronically Signed   By: Rise Mu M.D.   On: 07/21/2018 15:02    Procedures Procedures (including critical care time)  Medications Ordered in ED Medications - No data to display   Initial Impression / Assessment and Plan / ED Course  I have reviewed the triage vital signs and the nursing notes.  Pertinent labs & imaging results that were available during my care of the patient were reviewed by me and considered in my medical decision making (see chart for details).    The patient's exam is rather unremarkable, she seems to be at her baseline mental status at this time however there has been some waxing and waning confusion which could be consistent with a delirium, she has been off alcohol long enough that I doubt that this is delirium tremens and she does not appear clinically ill, has not had seizures diaphoresis or any other withdrawal symptoms.  She does not have any findings on her exam that suggest stroke, cardiac disease or fluid overload and her laboratory work-up thus far has been unremarkable.  We will proceed with a CT scan of the brain and a consult to psychiatry as well.  She does have some underlying paranoia which could raise suspicion for development of an underlying psychiatric disorder.  At change of shift, care signed out to Dr. Anitra Lauth to follow-up results and psychiatric  consultation.  Final Clinical Impressions(s) / ED Diagnoses   Final diagnoses:  Delirium  Urinary tract infection without hematuria, site unspecified    ED Discharge Orders         Ordered    sulfamethoxazole-trimethoprim (BACTRIM DS,SEPTRA DS) 800-160 MG tablet  2 times daily     07/21/18 1900           Eber Hong, MD 07/22/18 [phone]

## 2018-07-24 LAB — URINE CULTURE

## 2018-07-25 ENCOUNTER — Telehealth: Payer: Self-pay | Admitting: *Deleted

## 2018-07-25 NOTE — Telephone Encounter (Signed)
Post ED Visit - Positive Culture Follow-up  Culture report reviewed by antimicrobial stewardship pharmacist:  []  Enzo Bi, Pharm.D. []  Celedonio Miyamoto, Pharm.D., BCPS AQ-ID []  Garvin Fila, Pharm.D., BCPS []  Georgina Pillion, Pharm.D., BCPS []  [place], [address].D., BCPS, AAHIVP []  Estella Husk, Pharm.D., BCPS, AAHIVP []  Lysle Pearl, PharmD, BCPS []  Phillips Climes, PharmD, BCPS []  Agapito Games, PharmD, BCPS []  Verlan Friends, PharmD Beatrix Fetters, PharmD  Positive urine culture Treated with Sulfamethoxazole-Trimethoprim, organism sensitive to the same and no further patient follow-up is required at this time.  Virl Axe [facility] 07/25/2018, 9:19 AM

## 2018-08-01 ENCOUNTER — Encounter: Payer: Self-pay | Admitting: Internal Medicine

## 2018-08-01 ENCOUNTER — Ambulatory Visit (INDEPENDENT_AMBULATORY_CARE_PROVIDER_SITE_OTHER): Payer: Self-pay | Admitting: Internal Medicine

## 2018-08-01 VITALS — BP 132/70 | HR 64 | Ht 60.0 in | Wt 112.4 lb

## 2018-08-01 DIAGNOSIS — I5043 Acute on chronic combined systolic (congestive) and diastolic (congestive) heart failure: Secondary | ICD-10-CM

## 2018-08-01 DIAGNOSIS — I517 Cardiomegaly: Secondary | ICD-10-CM | POA: Insufficient documentation

## 2018-08-01 MED ORDER — LISINOPRIL 10 MG PO TABS: 10.0000 mg | ORAL_TABLET | Freq: Every day | ORAL | 11 refills | Status: DC

## 2018-08-01 NOTE — Patient Instructions (Signed)
Your physician has recommended you make the following change in your medication: INCREASE lisinopril to 10mg  daily  Your physician has ordered a cardiac nuclear stress test. This is done at [facility]. This is to look for cardiac infiltrative cardiomyopathy.   Your physician recommends that you schedule a follow-up appointment in 3 months.

## 2018-08-01 NOTE — Progress Notes (Signed)
OFFICE NOTE  Chief Complaint:  Hospital follow-up  Primary Care Physician: Hoy Register, MD  HPI:  Brandy Bond is a 67 y.o. female from [place] with a history of hypertension, osteoarthritis, tobacco abuse and alcohol abuse as well as medication noncompliance.  She presents with acute shortness of breath and elevated BNP concerning for congestive heart failure.  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  She has been aggressively diuresed about 9.6 L or 5 pounds since admission yesterday.  Her shortness of breath is improved significantly and she now appears euvolemic on exam.  And follow-up of the hospital today.  She has noted to increase weight from 10 2-1 12.  Despite this, her family member notes that she is eating better and is not getting any additional weight.  Her BMI is normal.  She denies any worsening shortness of breath or chest pain and feels that she is actually much improved.  She is compliant with her medication therapy.  PMHx:  Past Medical History:  Diagnosis Date  . Hypertension     Past Surgical History:  Procedure Laterality Date  . HAND SURGERY      FAMHx:  Family History  Problem Relation Age of Onset  . Heart failure Neg Hx   . CAD Neg Hx     SOCHx:   reports that she has quit smoking. She quit after 5.00 years of use. She has never used smokeless tobacco. She reports that she drank about 2.0 - 3.0 standard drinks of alcohol per week. She reports that she does not use drugs.  ALLERGIES:  Allergies  Allergen Reactions  . Penicillins Shortness Of Breath, Itching and Swelling    Has patient had a PCN reaction causing immediate rash, facial/tongue/throat swelling, SOB or lightheadedness with hypotension: yes Has patient had a PCN reaction causing severe rash involving mucus membranes or skin necrosis: no Has patient had a PCN  reaction that required hospitalization: unknown Has patient had a PCN reaction occurring within the last 10 years: no If all of the above answers are "NO", then may proceed with Cephalosporin use.     ROS: Pertinent items noted in HPI and remainder of comprehensive ROS otherwise negative.  HOME MEDS: Current Outpatient Medications on File Prior to Visit  Medication Sig Dispense Refill  . cetirizine (ZYRTEC) 10 MG tablet Take 1 tablet (10 mg total) by mouth daily. (Patient taking differently: Take 10 mg by mouth daily as needed for allergies. ) 30 tablet 1  . furosemide (LASIX) 20 MG tablet Take 1 tablet (20 mg total) by mouth daily. 90 tablet 1  . traMADol (ULTRAM) 50 MG tablet Take 1 tablet (50 mg total) by mouth every 12 (twelve) hours as needed. (Patient taking differently: Take 50 mg by mouth every 12 (twelve) hours as needed for moderate pain. ) 60 tablet 1  . atorvastatin (LIPITOR) 20 MG tablet Take 1 tablet (20 mg total) by mouth daily at 6 PM. 30 tablet 0  . carvedilol (COREG) 12.5 MG tablet Take 1 tablet (12.5 mg total) by mouth 2 (two) times daily with a meal. 60 tablet 0  . lisinopril (PRINIVIL,ZESTRIL) 5 MG tablet Take 1 tablet (5 mg total) by mouth daily. 30 tablet 0  . Potassium Chloride ER 20 MEQ TBCR Take 20 mEq by mouth daily. 30 tablet 0   No current facility-administered medications  on file prior to visit.     LABS/IMAGING: No results found for this or any previous visit (from the past 48 hour(s)). No results found.  LIPID PANEL:    Component Value Date/Time   CHOL 189 02/04/2018 0931   TRIG 69 02/04/2018 0931   HDL 63 02/04/2018 0931   CHOLHDL 3.0 02/04/2018 0931   CHOLHDL 3.6 10/11/2016 1110   VLDL 10 10/11/2016 1110   LDLCALC 112 (H) 02/04/2018 0931     WEIGHTS: Wt Readings from Last 3 Encounters:  08/01/18 112 lb 6.4 oz (51 kg)  06/28/18 102 lb 6.4 oz (46.4 kg)  06/23/18 120 lb (54.4 kg)    VITALS: BP 130/70 (BP Location: Left Arm)   Pulse 64    Ht 5' (1.524 m)   Wt 112 lb 6.4 oz (51 kg)   BMI 21.95 kg/m   EXAM: General appearance: alert and no distress Neck: no carotid bruit, no JVD and thyroid not enlarged, symmetric, no tenderness/mass/nodules Lungs: clear to auscultation bilaterally Heart: regular rate and rhythm, S1, S2 normal, no murmur, click, rub or gallop Abdomen: soft, non-tender; bowel sounds normal; no masses,  no organomegaly Extremities: extremities normal, atraumatic, no cyanosis or edema Pulses: 2+ and symmetric Skin: Skin color, texture, turgor normal. No rashes or lesions Neurologic: Grossly normal Psych: Pleasant  EKG: Normal sinus rhythm 64, voltage criteria for LVH-personally reviewed- personally reviewed  ASSESSMENT: 1. 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 Uncontrolled hypertension  PLAN: 1.   Ms. Bliley is better blood pressure control and a nonischemic cardiomyopathy which is possibly due to either uncontrolled hypertension or amyloid.  In the hospital we discussed outpatient work-up for amyloidosis and I would recommend a PYP study.  Blood pressure would allow a small increase in lisinopril and will go up to 10 mg daily today.  Plan a repeat echo in 6 to 12 months to look for improvements in LV function.  She is euvolemic and describes NYHA class I symptoms at this point.  Follow-up 3 months.  Chrystie Nose, MD, [facility], FACP  [place]  [facility]  Medical Director of the [facility] Lip[facility] &  Cardiovascular Risk Reduction Clinic Diplomate of the American Board of Clinical Lipidology Attending Cardiologist  Direct Dial: [phone]  Fax: [phone]  Website:  [email or web address]   Lisette Abu Newell Frater 08/01/2018, 8:25 AM

## 2018-08-08 ENCOUNTER — Telehealth (HOSPITAL_COMMUNITY): Payer: Self-pay

## 2018-08-08 ENCOUNTER — Ambulatory Visit (INDEPENDENT_AMBULATORY_CARE_PROVIDER_SITE_OTHER): Payer: Self-pay | Admitting: Orthopedic Surgery

## 2018-08-08 NOTE — Telephone Encounter (Signed)
Encounter complete. 

## 2018-08-08 NOTE — Telephone Encounter (Signed)
1) Medication(s) Requested (by name):furosemide (LASIX) 20 MG    2) Pharmacy of Choice: [facility]   3) Special Requests:   Approved medications will be sent to the pharmacy, we will reach out if there is an issue.  Requests made after 3pm may not be addressed until the following business day!  If a patient is unsure of the name of the medication(s) please note and ask patient to call back when they are able to provide all info, do not send to responsible party until all information is available! Patient all ready has refills but is asking for more

## 2018-08-13 ENCOUNTER — Ambulatory Visit (HOSPITAL_COMMUNITY)
Admission: RE | Admit: 2018-08-13 | Discharge: 2018-08-13 | Disposition: A | Discharge status: Home / Self Care | Payer: Self-pay | Source: Ambulatory Visit | Attending: Cardiovascular Disease | Admitting: Cardiovascular Disease

## 2018-08-13 DIAGNOSIS — I517 Cardiomegaly: Secondary | ICD-10-CM | POA: Insufficient documentation

## 2018-08-13 MED ORDER — TECHNETIUM TC 99M TETROFOSMIN IV KIT
7.8000 | PACK | Freq: Once | INTRAVENOUS | Status: DC | PRN
  Filled 2018-08-13: qty 8

## 2018-08-13 MED ORDER — TECHNETIUM TC 99M PYROPHOSPHATE
20.3000 | Freq: Once | INTRAVENOUS | Status: AC
  Administered 2018-08-13: 20.3 via INTRAVENOUS

## 2018-08-13 MED ORDER — TECHNETIUM TC 99M TETROFOSMIN IV KIT
25.7000 | PACK | Freq: Once | INTRAVENOUS | Status: DC | PRN
  Filled 2018-08-13: qty 26

## 2018-08-21 ENCOUNTER — Ambulatory Visit: Payer: Self-pay | Admitting: Family Medicine

## 2018-08-22 ENCOUNTER — Telehealth: Payer: Self-pay | Admitting: Internal Medicine

## 2018-08-22 NOTE — Telephone Encounter (Signed)
New Message:     Patient sister Amil Amen is calling for test results

## 2018-08-22 NOTE — Telephone Encounter (Signed)
Returned call to pt's cell phone, sister, Amil Amen answered, we do not have signed DPR to discuss this with sister. She states that pt does not speak english, informed Amil Amen that we do have a service that does provide translation for [place]. She states that we will not know this african language. I asked her to spell that name of the language, as to tell the translation service, and she did hang up the phone.

## 2018-08-25 ENCOUNTER — Telehealth: Payer: Self-pay | Admitting: Internal Medicine

## 2018-08-25 NOTE — Telephone Encounter (Signed)
Notes recorded by Chrystie Nose, MD on 08/22/2018 at 10:09 AM EDT Normal study for ATTR - please order cMRI to r/o AL amyloid per Dr. Delton See.  Dr. Rexene Edison

## 2018-08-25 NOTE — Telephone Encounter (Signed)
LMTCB

## 2018-08-25 NOTE — Telephone Encounter (Signed)
Follow Up:     Patient daughter is calling for test results, please before 9am.

## 2018-08-26 NOTE — Telephone Encounter (Signed)
Daughter notified of results (patient does not speak english)  She will discuss cardiac MRI with her mother (patient) & sister to determine if they wish to schedule

## 2018-10-20 ENCOUNTER — Encounter (INDEPENDENT_AMBULATORY_CARE_PROVIDER_SITE_OTHER): Payer: Self-pay | Admitting: Orthopedic Surgery

## 2018-10-20 ENCOUNTER — Ambulatory Visit (INDEPENDENT_AMBULATORY_CARE_PROVIDER_SITE_OTHER): Payer: Self-pay | Admitting: Orthopedic Surgery

## 2018-10-20 DIAGNOSIS — M25462 Effusion, left knee: Principal | ICD-10-CM

## 2018-10-20 DIAGNOSIS — M25461 Effusion, right knee: Secondary | ICD-10-CM

## 2018-10-21 ENCOUNTER — Encounter: Payer: Self-pay | Admitting: Internal Medicine

## 2018-10-21 ENCOUNTER — Ambulatory Visit (INDEPENDENT_AMBULATORY_CARE_PROVIDER_SITE_OTHER): Payer: Self-pay | Admitting: Internal Medicine

## 2018-10-21 VITALS — BP 172/88 | HR 64 | Ht 64.0 in | Wt 122.2 lb

## 2018-10-21 DIAGNOSIS — I517 Cardiomegaly: Secondary | ICD-10-CM

## 2018-10-21 DIAGNOSIS — I5042 Chronic combined systolic (congestive) and diastolic (congestive) heart failure: Secondary | ICD-10-CM

## 2018-10-21 DIAGNOSIS — I10 Essential (primary) hypertension: Secondary | ICD-10-CM

## 2018-10-21 DIAGNOSIS — I428 Other cardiomyopathies: Secondary | ICD-10-CM

## 2018-10-21 LAB — SYNOVIAL CELL COUNT + DIFF, W/ CRYSTALS
Basophils, %: 0 %
Basophils, %: 0 %
EOSINOPHILS-SYNOVIAL: 0 % (ref 0–2)
Eosinophils-Synovial: 0 % (ref 0–2)
LYMPHOCYTES-SYNOVIAL FLD: 9 % (ref 0–74)
Lymphocytes-Synovial Fld: 6 % (ref 0–74)
MONOCYTE/MACROPHAGE: 4 % (ref 0–69)
Monocyte/Macrophage: 2 % (ref 0–69)
Neutrophil, Synovial: 89 % — ABNORMAL HIGH (ref 0–24)
Neutrophil, Synovial: 90 % — ABNORMAL HIGH (ref 0–24)
SYNOVIOCYTES, %: 0 % (ref 0–15)
Synoviocytes, %: 0 % (ref 0–15)
WBC, Synovial: 10400 cells/uL — ABNORMAL HIGH (ref <150)
WBC, Synovial: 15480 cells/uL — ABNORMAL HIGH (ref <150)

## 2018-10-21 LAB — TIQ-NTM

## 2018-10-21 MED ORDER — FUROSEMIDE 20 MG PO TABS: 20.0000 mg | ORAL_TABLET | Freq: Every day | ORAL | 3 refills | Status: AC

## 2018-10-21 MED ORDER — LISINOPRIL 10 MG PO TABS: 10.0000 mg | ORAL_TABLET | Freq: Every day | ORAL | 3 refills | Status: DC

## 2018-10-21 MED ORDER — LISINOPRIL 20 MG PO TABS: 20.0000 mg | ORAL_TABLET | Freq: Every day | ORAL | 3 refills | Status: AC

## 2018-10-21 MED ORDER — CARVEDILOL 12.5 MG PO TABS: 12.5000 mg | ORAL_TABLET | Freq: Two times a day (BID) | ORAL | 3 refills | Status: AC

## 2018-10-21 NOTE — Progress Notes (Signed)
OFFICE NOTE  Chief Complaint:  Hospital follow-up  Primary Care Physician: Hoy Register, MD  HPI:  Brandy Bond is a 67 y.o. female from [place] with a history of hypertension, osteoarthritis, tobacco abuse and alcohol abuse as well as medication noncompliance.  She presents with acute shortness of breath and elevated BNP concerning for congestive heart failure.  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  She has been aggressively diuresed about 9.6 L or 5 pounds since admission yesterday.  Her shortness of breath is improved significantly and she now appears euvolemic on exam.  And follow-up of the hospital today.  She has noted to increase weight from 10 2-1 12.  Despite this, her family member notes that she is eating better and is not getting any additional weight.  Her BMI is normal.  She denies any worsening shortness of breath or chest pain and feels that she is actually much improved.  She is compliant with her medication therapy.  10/21/2018  Brandy Bond returns today for follow-up.  I have not seen her in a little while.  Notably her blood pressure is markedly elevated today 172/88.  Her daughter thinks this was because they just ate possibly salty meal and she is in pain.  She is continued to have pain with her knees.  Weight is up a little bit.  She denies any worsening swelling.  She says she is been compliant with her medications.  Blood pressure was well controlled.  She does have an upcoming office visit with Dr. August Saucer in February.  We did pursue possible amyloidosis however PYP study was negative.  A cardiac MRI was recommended however not obtained due to cost issues.  PMHx:  Past Medical History:  Diagnosis Date  . Hypertension     Past Surgical History:  Procedure Laterality Date  . HAND SURGERY      FAMHx:  Family History  Problem Relation Age of  Onset  . Heart failure Neg Hx   . CAD Neg Hx     SOCHx:   reports that she has quit smoking. She quit after 5.00 years of use. She has never used smokeless tobacco. She reports that she drank about 2.0 - 3.0 standard drinks of alcohol per week. She reports that she does not use drugs.  ALLERGIES:  Allergies  Allergen Reactions  . Penicillins Shortness Of Breath, Itching and Swelling    Has patient had a PCN reaction causing immediate rash, facial/tongue/throat swelling, SOB or lightheadedness with hypotension: yes Has patient had a PCN reaction causing severe rash involving mucus membranes or skin necrosis: no Has patient had a PCN reaction that required hospitalization: unknown Has patient had a PCN reaction occurring within the last 10 years: no If all of the above answers are "NO", then may proceed with Cephalosporin use.     ROS: Pertinent items noted in HPI and remainder of comprehensive ROS otherwise negative.  HOME MEDS: Current Outpatient Medications on File Prior to Visit  Medication Sig Dispense Refill  . carvedilol (COREG) 12.5 MG tablet Take 1 tablet (12.5 mg total) by mouth 2 (two) times daily with a meal. 60 tablet 0  . furosemide (LASIX) 20 MG tablet Take 1 tablet (20 mg total) by mouth daily. 90 tablet 1  . lisinopril (PRINIVIL,ZESTRIL) 10 MG tablet Take 1 tablet (10 mg total) by mouth daily.  30 tablet 11  . Potassium Chloride ER 20 MEQ TBCR Take 20 mEq by mouth daily. 30 tablet 0   No current facility-administered medications on file prior to visit.     LABS/IMAGING: Results for orders placed or performed in visit on 10/20/18 (from the past 48 hour(s))  Cell count + diff,  w/ cryst-synvl fld     Status: Abnormal   Collection Time: 10/20/18  4:12 PM  Result Value Ref Range   Site LT KNEE JOINT ASPIRATION    Color, Synovial YELLOW STRAW/YELL   Appearance-Synovial CLOUDY (A) CLEAR/HAZY   WBC, Synovial 15,480 (H) <150 cells/uL   Neutrophil, Synovial 89 (H) 0 -  24 %   Lymphocytes-Synovial Fld 9 0 - 74 %   Monocyte/Macrophage 2 0 - 69 %   Eosinophils-Synovial 0 0 - 2 %   Basophils, % 0 0 %   Synoviocytes, % 0 0 - 15 %   Crystals  NONE SEEN /HPF    Comment: No crystals found   Mucin Clot  GOOD    Comment: . Good clot High viscosity .   Cell count + diff,  w/ cryst-synvl fld     Status: Abnormal   Collection Time: 10/20/18  4:12 PM  Result Value Ref Range   Site RT KNEE JOINT ASPIRATION    Color, Synovial YELLOW STRAW/YELL   Appearance-Synovial CLOUDY (A) CLEAR/HAZY   WBC, Synovial 10,400 (H) <150 cells/uL   Neutrophil, Synovial 90 (H) 0 - 24 %   Lymphocytes-Synovial Fld 6 0 - 74 %   Monocyte/Macrophage 4 0 - 69 %   Eosinophils-Synovial 0 0 - 2 %   Basophils, % 0 0 %   Synoviocytes, % 0 0 - 15 %   Crystals  NONE SEEN /HPF    Comment: No crystals found   Mucin Clot  GOOD    Comment: . Good clot High viscosity .   TIQ-NTM     Status: None   Collection Time: 10/20/18  4:12 PM  Result Value Ref Range   QUESTION/PROBLEM:      Comment: . No test(s) are indicated on the requisition for the following specimen(s). .    SPECIMEN(S) RECEIVED: RF,ES     Comment: REQUESTED INFORMATION _________________________________ . AUTHORIZED SIGNATURE __________________________________ . TO PREVENT FURTHER DELAYS IN TESTING, PLEASE COMPLETE INFORMATION ABOVE AND FAX TO [phone] TO RESOLVE  THIS ORDER.   TIQ-NTM     Status: None   Collection Time: 10/20/18  4:12 PM  Result Value Ref Range   QUESTION/PROBLEM:      Comment: . No test(s) are indicated on the requisition for the following specimen(s). .    SPECIMEN(S) RECEIVED: RF,ES     Comment: REQUESTED INFORMATION _________________________________ . AUTHORIZED SIGNATURE __________________________________ . TO PREVENT FURTHER DELAYS IN TESTING, PLEASE COMPLETE INFORMATION ABOVE AND FAX TO [phone] TO RESOLVE  THIS ORDER.    No results found.  LIPID PANEL:    Component  Value Date/Time   CHOL 189 02/04/2018 0931   TRIG 69 02/04/2018 0931   HDL 63 02/04/2018 0931   CHOLHDL 3.0 02/04/2018 0931   CHOLHDL 3.6 10/11/2016 1110   VLDL 10 10/11/2016 1110   LDLCALC 112 (H) 02/04/2018 0931     WEIGHTS: Wt Readings from Last 3 Encounters:  10/21/18 122 lb 3.2 oz (55.4 kg)  08/13/18 112 lb (50.8 kg)  08/01/18 112 lb 6.4 oz (51 kg)    VITALS: BP (!) 172/88   Pulse 64   Ht 5\' 4"  (1.626  m)   Wt 122 lb 3.2 oz (55.4 kg)   BMI 20.98 kg/m   EXAM: General appearance: alert and no distress Neck: no carotid bruit, no JVD and thyroid not enlarged, symmetric, no tenderness/mass/nodules Lungs: clear to auscultation bilaterally Heart: regular rate and rhythm, S1, S2 normal, no murmur, click, rub or gallop Abdomen: soft, non-tender; bowel sounds normal; no masses,  no organomegaly Extremities: extremities normal, atraumatic, no cyanosis or edema Pulses: 2+ and symmetric Skin: Skin color, texture, turgor normal. No rashes or lesions Neurologic: Grossly normal Psych: Pleasant  EKG: Sinus rhythm with voltage criteria for LVH at 66-personally reviewed  ASSESSMENT: 1. 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 Uncontrolled hypertension  PLAN: 1.   Brandy Bond has poorly controlled hypertension today, but it has been better controlled in the past.  Repeated a second blood pressure which came down to 146/78.  This is still not at goal and given her cardiomyopathy, she benefit from maximal medical therapy.  I recommend increasing her lisinopril from 20 to 40 mg daily.  She can double up on her current tablets and will provide a new prescription.  She should also continue her Lasix, carvedilol and potassium.  Will provide refills as well for that.  Plan to see her back in 7 months, roughly after July with a repeat echo at that time to see if she has had any improvement in LV function.  Chrystie Nose, MD, [facility], FACP   [place]  [facility]  Medical Director of the [facility]  [facility] Clinic Diplomate of the American Board of Clinical Lipidology Attending Cardiologist  Direct Dial: [phone]  Fax: [phone]  Website:  [email or web address]  10/21/2018, 2:26 PM

## 2018-10-21 NOTE — Patient Instructions (Signed)
Medication Instructions:  INCREASE lisinopril to 20mg  daily If you need a refill on your cardiac medications before your next appointment, please call your pharmacy.   Lab work: NONE If you have labs (blood work) drawn today and your tests are completely normal, you will receive your results only by: Marland Kitchen MyChart Message (if you have MyChart) OR . A paper copy in the mail If you have any lab test that is abnormal or we need to change your treatment, we will call you to review the results.  Testing/Procedures: Your physician has requested that you have an echocardiogram. Echocardiography is a painless test that uses sound waves to create images of your heart. It provides your doctor with information about the size and shape of your heart and how well your heart's chambers and valves are working. This procedure takes approximately one hour. There are no restrictions for this procedure. -- due July 2020 -- done at [address] - [address]  Follow-Up: At [facility], you and your health needs are our priority.  As part of our continuing mission to provide you with exceptional heart care, we have created designated Provider Care Teams.  These Care Teams include your primary Cardiologist (physician) and Advanced Practice Providers (APPs -  Physician Assistants and Nurse Practitioners) who all work together to provide you with the care you need, when you need it. You will need a follow up appointment in July 2020 (after echocardiogram)  Please call our office 2 months in advance to schedule this appointment.  You may see Dr. Rennis Golden or one of the following Advanced Practice Providers on your designated Care Team: Azalee Course, [place] . Micah Flesher, PA-C  Any Other Special Instructions Will Be Listed Below (If Applicable).

## 2018-10-22 ENCOUNTER — Encounter (INDEPENDENT_AMBULATORY_CARE_PROVIDER_SITE_OTHER): Payer: Self-pay | Admitting: Orthopedic Surgery

## 2018-10-22 DIAGNOSIS — M25461 Effusion, right knee: Secondary | ICD-10-CM

## 2018-10-22 DIAGNOSIS — M25462 Effusion, left knee: Secondary | ICD-10-CM

## 2018-10-22 MED ORDER — LIDOCAINE HCL 1 % IJ SOLN
5.0000 mL | INTRAMUSCULAR | Status: AC | PRN
  Administered 2018-10-22: 5 mL

## 2018-10-22 MED ORDER — BUPIVACAINE HCL 0.25 % IJ SOLN
4.0000 mL | INTRAMUSCULAR | Status: AC | PRN
  Administered 2018-10-22: 4 mL via INTRA_ARTICULAR

## 2018-10-22 MED ORDER — METHYLPREDNISOLONE ACETATE 40 MG/ML IJ SUSP
40.0000 mg | INTRAMUSCULAR | Status: AC | PRN
  Administered 2018-10-22: 40 mg via INTRA_ARTICULAR

## 2018-10-22 NOTE — Progress Notes (Signed)
Office Visit Note   Patient: Brandy Bond           Date of Birth: 1951/11/20           MRN: [id number] Visit Date: 10/20/2018 Requested by: Hoy Register, MD [address] [place], [place] [address] PCP: Hoy Register, MD  Subjective: Chief Complaint  Patient presents with  . Right Knee - Pain  . Left Knee - Pain    HPI: Brandy Bond is a patient with known bilateral knee arthritis.  She has aspiration and injection several times a year.  She is not had any interval injury.  She does report diminished ability to ambulate.  No fevers or chills.  Does take over-the-counter medication for the problem.  Here with daughter and husband today.              ROS: All systems reviewed are negative as they relate to the chief complaint within the history of present illness.  Patient denies  fevers or chills.   Assessment & Plan: Visit Diagnoses:  1. Effusion of both knee joints     Plan: Impression is bilateral knee pain and arthritis.  Plan is bilateral knee aspiration and injection today.  At the time of this dictation the cell count in the fluid came back around 12,000.  No crystals.  This may represent some type of inflammatory arthritis which does not appear to be infectious.  We will see her back as needed.  Follow-Up Instructions: Return if symptoms worsen or fail to improve.   Orders:  Orders Placed This Encounter  Procedures  . Cell count + diff,  w/ cryst-synvl fld  . Cell count + diff,  w/ cryst-synvl fld  . TIQ-NTM  . TIQ-NTM   No orders of the defined types were placed in this encounter.     Procedures: Large Joint Inj: bilateral knee on 10/22/2018 10:59 PM Indications: diagnostic evaluation, joint swelling and pain Details: 18 G 1.5 in needle, superolateral approach  Arthrogram: No  Medications (Right): 5 mL lidocaine 1 %; 4 mL bupivacaine 0.25 %; 40 mg methylPREDNISolone acetate 40 MG/ML Aspirate (Right): 30 mL cloudy; sent for lab analysis Medications  (Left): 5 mL lidocaine 1 %; 4 mL bupivacaine 0.25 %; 40 mg methylPREDNISolone acetate 40 MG/ML Aspirate (Left): 40 mL cloudy; sent for lab analysis Outcome: tolerated well, no immediate complications Procedure, treatment alternatives, risks and benefits explained, specific risks discussed. Consent was given by the patient. Immediately prior to procedure a time out was called to verify the correct patient, procedure, equipment, support staff and site/side marked as required. Patient was prepped and draped in the usual sterile fashion.       Clinical Data: No additional findings.  Objective: Vital Signs: There were no vitals taken for this visit.  Physical Exam:   Constitutional: Patient appears well-developed HEENT:  Head: Normocephalic Eyes:EOM are normal Neck: Normal range of motion Cardiovascular: Normal rate Pulmonary/chest: Effort normal Neurologic: Patient is alert Skin: Skin is warm Psychiatric: Patient has normal mood and affect    Ortho Exam: Ortho exam demonstrates full active and passive range of motion of the ankles and hips.  She has valgus alignment bilateral lower extremities with mild effusion in both knees.  Extensor mechanism is intact but range of motion is painful.  She has a flexion contracture of about 8 or 9 degrees and flexion to about.  Specialty Comments:  No specialty comments available.  Imaging: No results found.   PMFS History: Patient Active Problem  List   Diagnosis Date Noted  . LVH (left ventricular hypertrophy) 08/01/2018  . Biatrial enlargement 08/01/2018  . Acute on chronic combined systolic and diastolic CHF (congestive heart failure) (HCC) 08/01/2018  . Malnutrition of moderate degree 06/27/2018  . Congestive heart failure (CHF) (HCC) 06/26/2018  . Hypokalemia 06/26/2018  . Hyperglycemia 06/26/2018  . Hyperlipidemia 06/26/2018  . Osteoarthritis of left knee 10/11/2016  . Hypertension 09/18/2016  . CONSTIPATION, CHRONIC  10/11/2010   Past Medical History:  Diagnosis Date  . Hypertension     Family History  Problem Relation Age of Onset  . Heart failure Neg Hx   . CAD Neg Hx     Past Surgical History:  Procedure Laterality Date  . HAND SURGERY     Social History   Occupational History  . Occupation: retired  Tobacco Use  . Smoking status: Former Smoker    Years: 5.00  . Smokeless tobacco: Never Used  . Tobacco comment: more than 10 years ago  Substance and Sexual Activity  . Alcohol use: Not Currently    Alcohol/week: 2.0 - 3.0 standard drinks    Types: 2 - 3 Cans of beer per week    Comment: occasionally  . Drug use: No  . Sexual activity: Not Currently

## 2018-11-04 ENCOUNTER — Ambulatory Visit: Payer: Medicaid Other | Admitting: Internal Medicine

## 2019-01-21 ENCOUNTER — Ambulatory Visit (INDEPENDENT_AMBULATORY_CARE_PROVIDER_SITE_OTHER): Payer: Self-pay | Admitting: Orthopedic Surgery

## 2019-02-09 ENCOUNTER — Ambulatory Visit (INDEPENDENT_AMBULATORY_CARE_PROVIDER_SITE_OTHER): Payer: Self-pay | Admitting: Orthopedic Surgery

## 2019-05-14 ENCOUNTER — Telehealth (HOSPITAL_COMMUNITY): Payer: Self-pay | Admitting: Radiology

## 2019-05-14 NOTE — Telephone Encounter (Signed)
Left message to call office-Patient needs to schedule an echocardiogram.  

## 2019-05-15 NOTE — Telephone Encounter (Signed)
Patient is deceased. I have requested patient be made deceased.

## 2019-08-06 ENCOUNTER — Telehealth: Payer: Self-pay | Admitting: *Deleted

## 2019-08-06 NOTE — Telephone Encounter (Signed)
A message was left, re: follow up visit. 

## 2020-02-27 IMAGING — DX DG CHEST 1V PORT
1 series · 1 of 1 positions shown · non-contrast
Comparison: 12/14/2011.

CLINICAL DATA: Shortness of breath.

EXAM:
PORTABLE CHEST 1 VIEW

[chest]
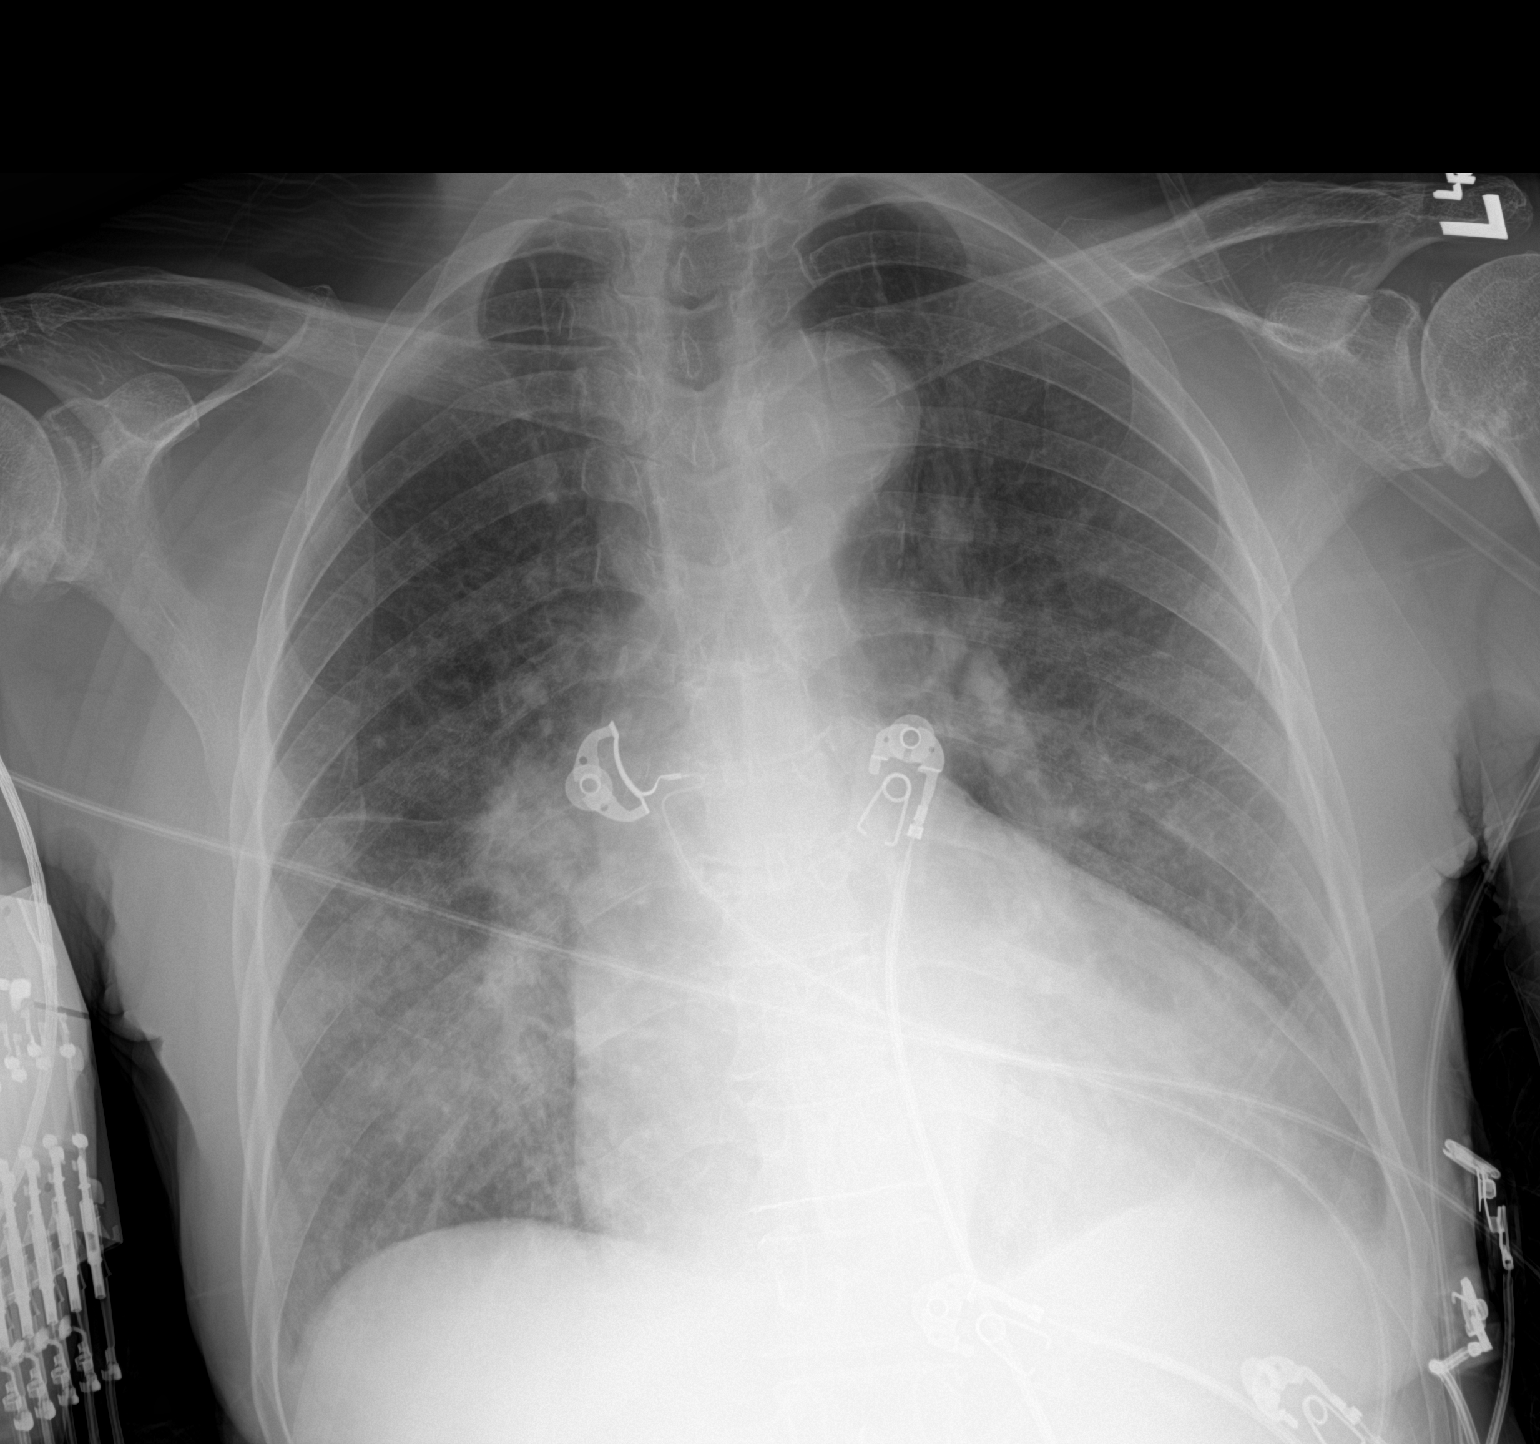

[1 of 1 positions shown; findings below may reference images not displayed]

FINDINGS: Cardiomegaly with diffuse bilateral pulmonary infiltrates consistent
with pulmonary edema. Small bilateral pleural effusions. These
findings suggest CHF. No pneumothorax. No acute bony abnormality.
Degenerative changes both shoulders.
IMPRESSION: Congestive heart failure with bilateral pulmonary edema and small
bilateral pleural effusions.

## 2020-03-23 IMAGING — CT CT HEAD W/O CM
4 series · 16 of 47 positions shown, 18 images · non-contrast
Comparison: Prior CT from 12/14/2011.

CLINICAL DATA: Initial evaluation for acute altered mental status.

EXAM:
CT HEAD WITHOUT CONTRAST
TECHNIQUE: Contiguous axial images were obtained from the base of the skull
through the vertex without intravenous contrast.

[Series 3: head without · axial · non-contrast · 0.44mm/px · z∈[-45,+60]mm · 7 of 29 slices shown, 9 images]
[im 4/29  brain]
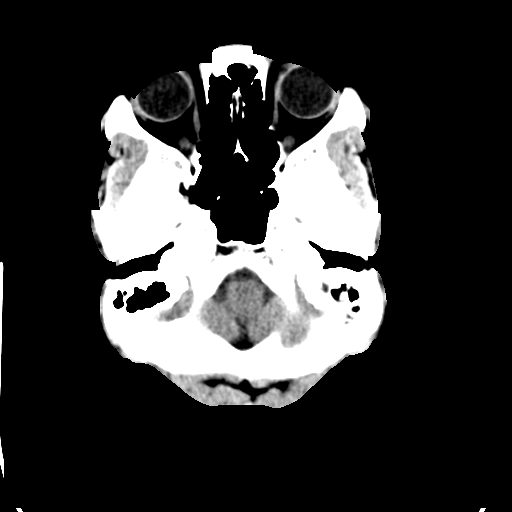
[im 4/29  bone]
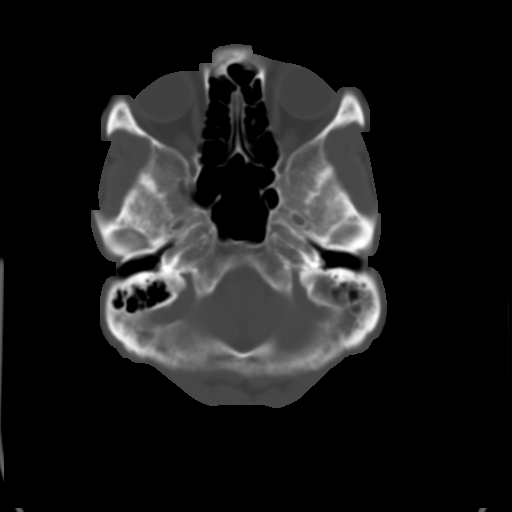
[im 8/29  brain]
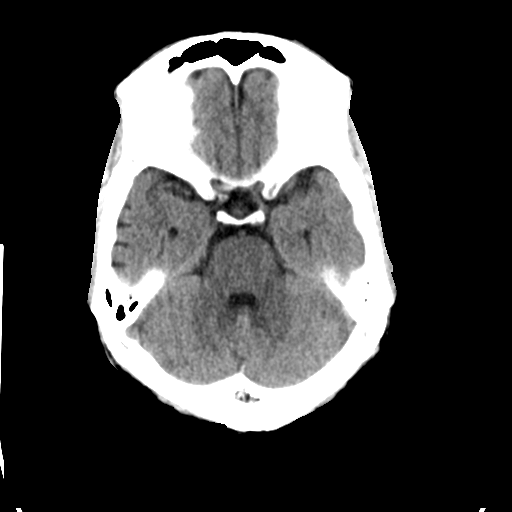
[im 11/29  brain]
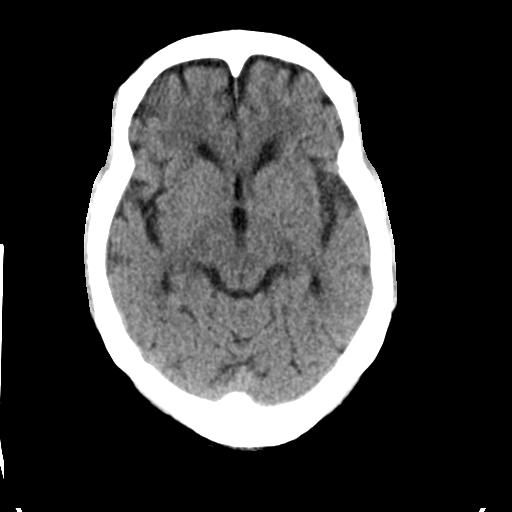
[im 15/29  brain]
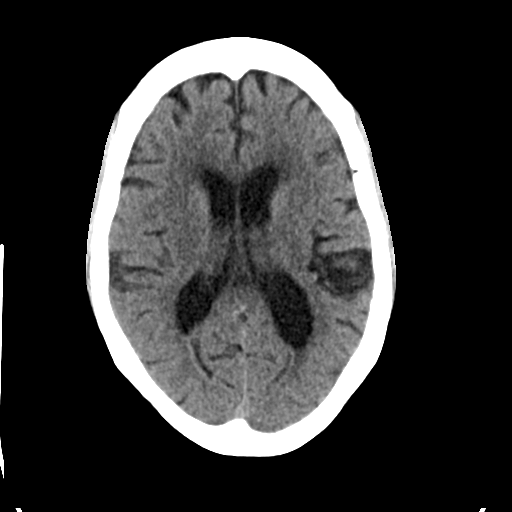
[im 18/29  brain]
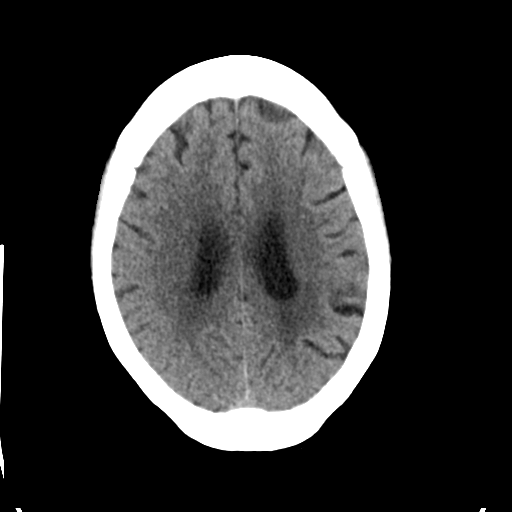
[im 18/29  bone]
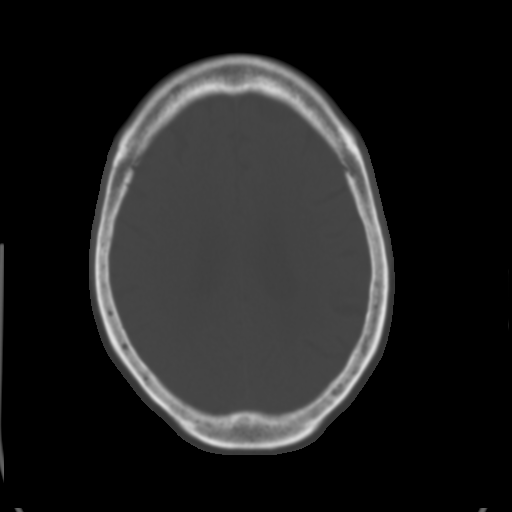
[im 22/29  brain]
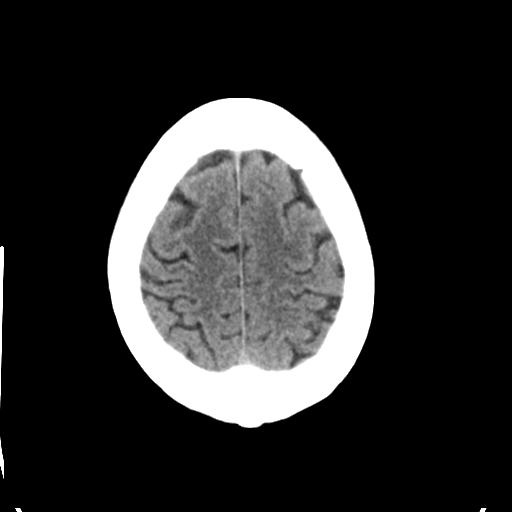
[im 25/29  brain]
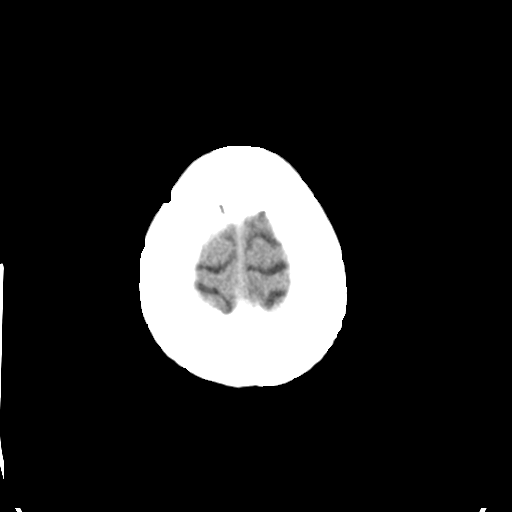

[Series 4: head bone · axial · 0.44mm/px · z∈[-46,-18]mm · 3 of 72 slices shown]
[im 8/72  bone]
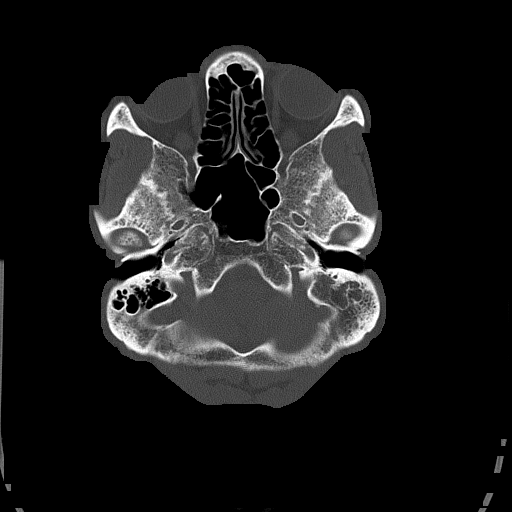
[im 15/72  bone]
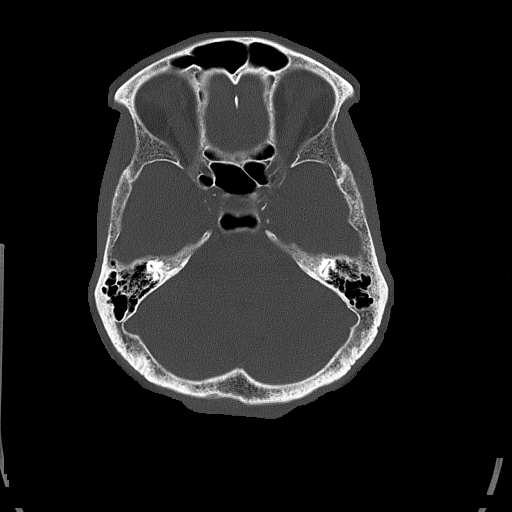
[im 22/72  bone]
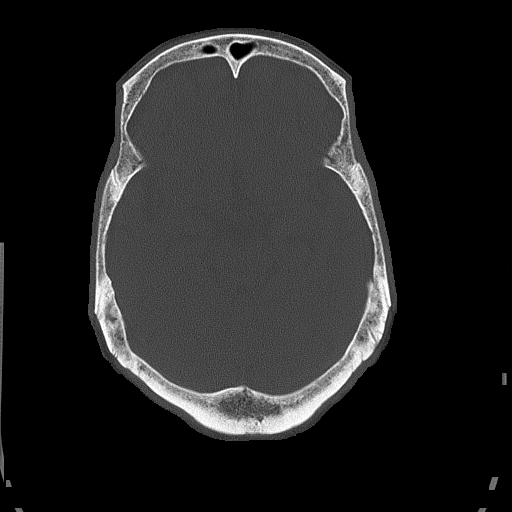

[Series 5: head without cor · coronal · non-contrast · 0.30mm/px · 3 of 62 slices shown]
[im 21/62  brain]
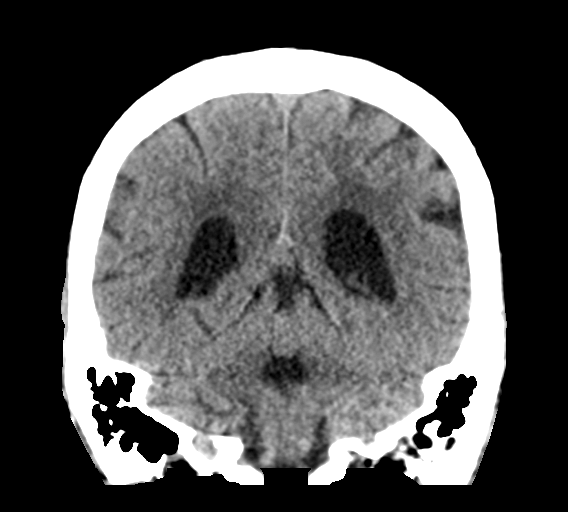
[im 28/62  brain]
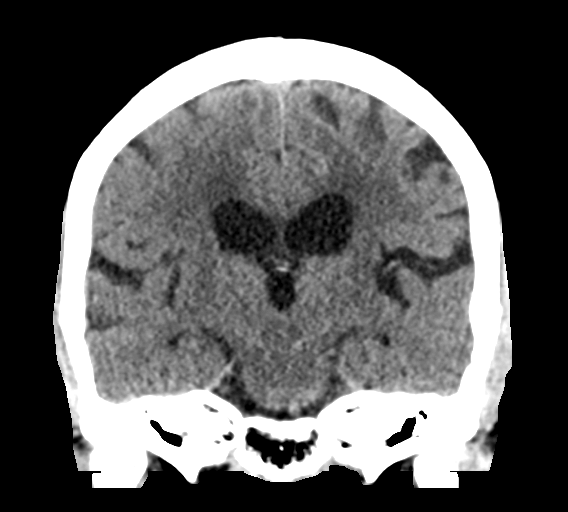
[im 34/62  brain]
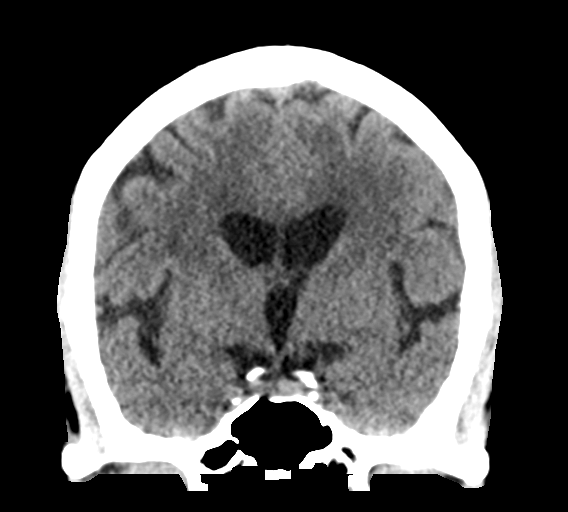

[Series 6: head without sag · sagittal · non-contrast · 0.29mm/px · 3 of 48 slices shown]
[im 16/48  brain]
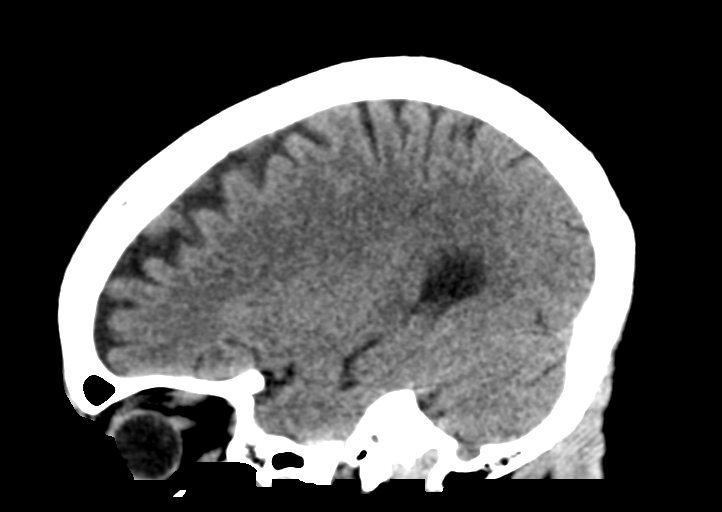
[im 24/48  brain]
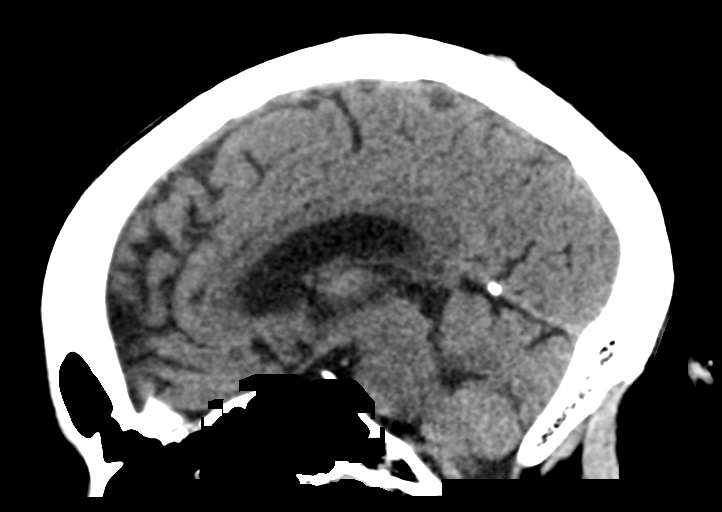
[im 32/48  brain]
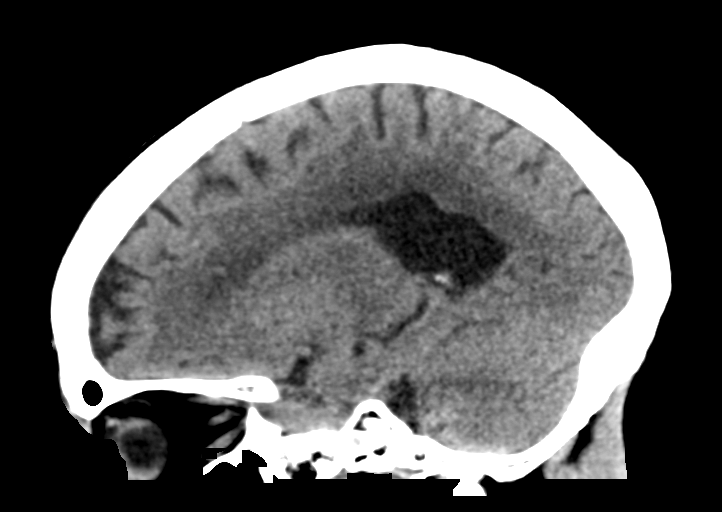

[16 of 47 positions shown; findings below may reference images not displayed]

FINDINGS: Brain: Generalized age-related cerebral atrophy with mild chronic
small vessel ischemic disease. Small remote left frontoparietal
infarct noted. No acute intracranial hemorrhage. No acute large
vessel territory infarct. No mass lesion, midline shift or mass
effect. No hydrocephalus. No extra-axial fluid collection.

Vascular: No hyperdense vessel. Scattered vascular calcifications
noted within the carotid siphons.

Skull: Scalp soft tissues and calvarium within normal limits.

Sinuses/Orbits: Globes and orbital soft tissues normal. Paranasal
sinuses and mastoid air cells are clear.

Other: None.
IMPRESSION: 1. No acute intracranial abnormality.
2. Small remote left frontoparietal cortical infarct.
3. Age-related cerebral atrophy with mild chronic small vessel
ischemic disease.

## 2020-04-18 ENCOUNTER — Other Ambulatory Visit: Payer: Self-pay | Admitting: Pharmacist
